# Patient Record
Sex: Female | Born: 1970 | Race: White | Hispanic: No | Marital: Single | Smoking: Current every day smoker
Health system: Southern US, Community
[De-identification: ages and names within clinical notes are randomized; demographics above are authoritative.]

## PROBLEM LIST (undated history)

## (undated) DIAGNOSIS — I10 Essential (primary) hypertension: Secondary | ICD-10-CM

## (undated) DIAGNOSIS — J449 Chronic obstructive pulmonary disease, unspecified: Secondary | ICD-10-CM

## (undated) DIAGNOSIS — G43909 Migraine, unspecified, not intractable, without status migrainosus: Secondary | ICD-10-CM

## (undated) HISTORY — PX: TUBAL LIGATION: SHX77

---

## 2001-01-14 ENCOUNTER — Encounter: Payer: Self-pay | Admitting: *Deleted

## 2001-01-14 ENCOUNTER — Emergency Department (HOSPITAL_COMMUNITY): Admission: EM | Admit: 2001-01-14 | Discharge: 2001-01-14 | Payer: Self-pay | Admitting: *Deleted

## 2001-04-06 ENCOUNTER — Other Ambulatory Visit: Admission: RE | Admit: 2001-04-06 | Discharge: 2001-04-06 | Payer: Self-pay

## 2004-03-28 ENCOUNTER — Emergency Department (HOSPITAL_COMMUNITY): Admission: EM | Admit: 2004-03-28 | Discharge: 2004-03-28 | Payer: Self-pay | Admitting: Emergency Medicine

## 2004-04-22 ENCOUNTER — Ambulatory Visit: Payer: Self-pay | Admitting: Family Medicine

## 2004-04-26 ENCOUNTER — Ambulatory Visit: Payer: Self-pay | Admitting: Family Medicine

## 2004-05-27 ENCOUNTER — Ambulatory Visit: Payer: Self-pay | Admitting: Family Medicine

## 2004-06-26 ENCOUNTER — Ambulatory Visit: Payer: Self-pay | Admitting: Family Medicine

## 2004-09-26 ENCOUNTER — Ambulatory Visit: Payer: Self-pay | Admitting: Family Medicine

## 2004-12-30 ENCOUNTER — Ambulatory Visit: Payer: Self-pay | Admitting: Family Medicine

## 2004-12-30 ENCOUNTER — Ambulatory Visit (HOSPITAL_COMMUNITY): Admission: RE | Admit: 2004-12-30 | Discharge: 2004-12-30 | Payer: Self-pay | Admitting: Family Medicine

## 2005-01-01 ENCOUNTER — Emergency Department (HOSPITAL_COMMUNITY): Admission: EM | Admit: 2005-01-01 | Discharge: 2005-01-01 | Payer: Self-pay | Admitting: Emergency Medicine

## 2005-01-06 ENCOUNTER — Emergency Department (HOSPITAL_COMMUNITY): Admission: EM | Admit: 2005-01-06 | Discharge: 2005-01-06 | Payer: Self-pay | Admitting: Emergency Medicine

## 2005-01-06 ENCOUNTER — Ambulatory Visit: Payer: Self-pay | Admitting: Family Medicine

## 2005-01-07 ENCOUNTER — Emergency Department (HOSPITAL_COMMUNITY): Admission: EM | Admit: 2005-01-07 | Discharge: 2005-01-07 | Payer: Self-pay | Admitting: Emergency Medicine

## 2005-01-07 ENCOUNTER — Ambulatory Visit (HOSPITAL_COMMUNITY): Admission: RE | Admit: 2005-01-07 | Discharge: 2005-01-07 | Payer: Self-pay | Admitting: Family Medicine

## 2005-01-07 ENCOUNTER — Ambulatory Visit (HOSPITAL_COMMUNITY): Admission: RE | Admit: 2005-01-07 | Discharge: 2005-01-07 | Payer: Self-pay | Admitting: Emergency Medicine

## 2005-01-22 ENCOUNTER — Ambulatory Visit: Payer: Self-pay | Admitting: Internal Medicine

## 2005-04-22 ENCOUNTER — Ambulatory Visit: Payer: Self-pay | Admitting: Internal Medicine

## 2005-04-28 ENCOUNTER — Ambulatory Visit: Payer: Self-pay | Admitting: Internal Medicine

## 2005-05-06 ENCOUNTER — Ambulatory Visit: Payer: Self-pay | Admitting: Family Medicine

## 2005-09-08 ENCOUNTER — Emergency Department (HOSPITAL_COMMUNITY): Admission: EM | Admit: 2005-09-08 | Discharge: 2005-09-08 | Payer: Self-pay | Admitting: Emergency Medicine

## 2005-11-14 ENCOUNTER — Emergency Department (HOSPITAL_COMMUNITY): Admission: EM | Admit: 2005-11-14 | Discharge: 2005-11-14 | Payer: Self-pay | Admitting: Emergency Medicine

## 2005-11-27 ENCOUNTER — Emergency Department (HOSPITAL_COMMUNITY): Admission: EM | Admit: 2005-11-27 | Discharge: 2005-11-27 | Payer: Self-pay | Admitting: Emergency Medicine

## 2006-03-18 ENCOUNTER — Emergency Department (HOSPITAL_COMMUNITY): Admission: EM | Admit: 2006-03-18 | Discharge: 2006-03-18 | Payer: Self-pay | Admitting: Emergency Medicine

## 2006-08-10 ENCOUNTER — Emergency Department (HOSPITAL_COMMUNITY): Admission: EM | Admit: 2006-08-10 | Discharge: 2006-08-10 | Payer: Self-pay | Admitting: Emergency Medicine

## 2006-10-22 ENCOUNTER — Emergency Department (HOSPITAL_COMMUNITY): Admission: EM | Admit: 2006-10-22 | Discharge: 2006-10-22 | Payer: Self-pay | Admitting: Emergency Medicine

## 2007-02-18 ENCOUNTER — Encounter: Payer: Self-pay | Admitting: Family Medicine

## 2007-05-05 ENCOUNTER — Emergency Department (HOSPITAL_COMMUNITY): Admission: EM | Admit: 2007-05-05 | Discharge: 2007-05-05 | Payer: Self-pay | Admitting: Emergency Medicine

## 2009-04-02 ENCOUNTER — Ambulatory Visit (HOSPITAL_COMMUNITY): Admission: RE | Admit: 2009-04-02 | Discharge: 2009-04-02 | Payer: Self-pay | Admitting: Family Medicine

## 2010-02-04 ENCOUNTER — Ambulatory Visit (HOSPITAL_COMMUNITY)
Admission: RE | Admit: 2010-02-04 | Discharge: 2010-02-04 | Payer: Self-pay | Source: Home / Self Care | Attending: Family Medicine | Admitting: Family Medicine

## 2010-03-11 ENCOUNTER — Encounter: Payer: Self-pay | Admitting: Internal Medicine

## 2010-03-19 NOTE — Letter (Signed)
Summary: rpc chart  rpc chart   Imported By: Curtis Sites 11/29/2009 15:47:20  _____________________________________________________________________  External Attachment:    Type:   Image     Comment:   External Document

## 2010-11-11 LAB — CBC
HCT: 43.6
Hemoglobin: 15.3 — ABNORMAL HIGH
MCHC: 35.1
MCV: 100.6 — ABNORMAL HIGH
Platelets: 447 — ABNORMAL HIGH
RBC: 4.33
RDW: 14.8
WBC: 10.1

## 2010-11-11 LAB — URINALYSIS, ROUTINE W REFLEX MICROSCOPIC
Bilirubin Urine: NEGATIVE
Glucose, UA: NEGATIVE
Ketones, ur: NEGATIVE
Leukocytes, UA: NEGATIVE
Nitrite: NEGATIVE
Protein, ur: NEGATIVE
Specific Gravity, Urine: 1.015
Urobilinogen, UA: 0.2
pH: 5.5

## 2010-11-11 LAB — ETHANOL: Alcohol, Ethyl (B): <5

## 2010-11-11 LAB — URINE MICROSCOPIC-ADD ON

## 2010-11-11 LAB — DIFFERENTIAL
Basophils Absolute: 0.1
Basophils Relative: 1
Eosinophils Absolute: 0.1
Eosinophils Relative: 1
Lymphocytes Relative: 19
Lymphs Abs: 1.9
Monocytes Absolute: 0.3
Monocytes Relative: 3
Neutro Abs: 7.8 — ABNORMAL HIGH
Neutrophils Relative %: 77

## 2010-11-11 LAB — BASIC METABOLIC PANEL
BUN: 6
CO2: 25
Calcium: 9.7
Chloride: 107
Creatinine, Ser: 0.77
GFR calc Af Amer: >60
GFR calc non Af Amer: >60
Glucose, Bld: 99
Potassium: 3.6
Sodium: 139

## 2010-11-11 LAB — RAPID URINE DRUG SCREEN, HOSP PERFORMED
Amphetamines: NOT DETECTED
Barbiturates: NOT DETECTED
Benzodiazepines: POSITIVE — AB
Cocaine: NOT DETECTED
Opiates: NOT DETECTED
Tetrahydrocannabinol: POSITIVE — AB

## 2010-11-11 LAB — PREGNANCY, URINE: Preg Test, Ur: NEGATIVE

## 2010-12-04 LAB — URINALYSIS, ROUTINE W REFLEX MICROSCOPIC
Bilirubin Urine: NEGATIVE
Glucose, UA: NEGATIVE
Ketones, ur: NEGATIVE
Leukocytes, UA: NEGATIVE
Nitrite: NEGATIVE
Protein, ur: NEGATIVE
Specific Gravity, Urine: <1.005 — ABNORMAL LOW
Urobilinogen, UA: 0.2
pH: 6.5

## 2010-12-04 LAB — DIFFERENTIAL
Blasts: 0
Lymphocytes Relative: 28
Myelocytes: 0
Neutrophils Relative %: 67
Promyelocytes Absolute: 0

## 2010-12-04 LAB — COMPREHENSIVE METABOLIC PANEL
Alkaline Phosphatase: 95
BUN: 7
CO2: 26
Chloride: 100
Glucose, Bld: 81
Potassium: 2.9 — ABNORMAL LOW
Total Bilirubin: 0.6

## 2010-12-04 LAB — URINE MICROSCOPIC-ADD ON

## 2010-12-04 LAB — PREGNANCY, URINE: Preg Test, Ur: NEGATIVE

## 2010-12-04 LAB — CBC
HCT: 42.1
Hemoglobin: 14
RBC: 4.36
WBC: 8.5

## 2010-12-04 LAB — LIPASE, BLOOD: Lipase: 24

## 2011-03-27 ENCOUNTER — Other Ambulatory Visit (HOSPITAL_COMMUNITY): Payer: Self-pay | Admitting: Family Medicine

## 2011-03-27 DIAGNOSIS — Z139 Encounter for screening, unspecified: Secondary | ICD-10-CM

## 2011-03-28 ENCOUNTER — Ambulatory Visit (HOSPITAL_COMMUNITY)
Admission: RE | Admit: 2011-03-28 | Discharge: 2011-03-28 | Disposition: A | Discharge status: Home / Self Care | Payer: PRIVATE HEALTH INSURANCE | Source: Ambulatory Visit | Attending: Family Medicine | Admitting: Family Medicine

## 2011-03-28 DIAGNOSIS — Z1231 Encounter for screening mammogram for malignant neoplasm of breast: Secondary | ICD-10-CM | POA: Insufficient documentation

## 2011-03-28 DIAGNOSIS — Z139 Encounter for screening, unspecified: Secondary | ICD-10-CM

## 2012-05-16 ENCOUNTER — Encounter: Payer: Self-pay | Admitting: Internal Medicine

## 2012-05-16 NOTE — Assessment & Plan Note (Signed)
Could simply be aspiration pneumonitis that will resolve spontaneously and fevers are entirely due to the buttock lesion.  Continue aspiration precautions, adequate hydration, and obtain CXR to r/o pneumonia.  Call results to md/np for orders.

## 2012-05-16 NOTE — Assessment & Plan Note (Signed)
Abscess is suspected.  Labs including cbc and bmp were ordered to assess for wbc and to determine if she is volume deplete from her fevers (also in anticipation of switching to IV abx) from keflex.  Will also obtain ultrasound of buttock to confirm that this is an abscess.  She will likely need surgical intervention if her mother would want this at this point.

## 2014-09-22 ENCOUNTER — Encounter (HOSPITAL_COMMUNITY): Payer: Self-pay | Admitting: Emergency Medicine

## 2014-09-22 ENCOUNTER — Emergency Department (HOSPITAL_COMMUNITY): Payer: Medicaid Other

## 2014-09-22 ENCOUNTER — Emergency Department (HOSPITAL_COMMUNITY)
Admission: EM | Admit: 2014-09-22 | Discharge: 2014-09-22 | Disposition: A | Discharge status: Home / Self Care | Payer: Medicaid Other | Attending: Emergency Medicine | Admitting: Emergency Medicine

## 2014-09-22 DIAGNOSIS — Y9289 Other specified places as the place of occurrence of the external cause: Secondary | ICD-10-CM | POA: Insufficient documentation

## 2014-09-22 DIAGNOSIS — X58XXXA Exposure to other specified factors, initial encounter: Secondary | ICD-10-CM | POA: Insufficient documentation

## 2014-09-22 DIAGNOSIS — S99921A Unspecified injury of right foot, initial encounter: Secondary | ICD-10-CM | POA: Insufficient documentation

## 2014-09-22 DIAGNOSIS — Y9389 Activity, other specified: Secondary | ICD-10-CM | POA: Insufficient documentation

## 2014-09-22 DIAGNOSIS — Y998 Other external cause status: Secondary | ICD-10-CM | POA: Insufficient documentation

## 2014-09-22 DIAGNOSIS — Z720 Tobacco use: Secondary | ICD-10-CM | POA: Insufficient documentation

## 2014-09-22 MED ORDER — IBUPROFEN 800 MG PO TABS
800.0000 mg | ORAL_TABLET | Freq: Once | ORAL | Status: AC
  Administered 2014-09-22: 800 mg via ORAL
  Filled 2014-09-22: qty 1

## 2014-09-22 MED ORDER — NAPROXEN 500 MG PO TABS: 500.0000 mg | ORAL_TABLET | Freq: Two times a day (BID) | ORAL | Status: DC

## 2014-09-22 MED ORDER — HYDROCODONE-ACETAMINOPHEN 5-325 MG PO TABS
1.0000 | ORAL_TABLET | Freq: Once | ORAL | Status: AC
  Administered 2014-09-22: 1 via ORAL
  Filled 2014-09-22: qty 1

## 2014-09-22 MED ORDER — HYDROCODONE-ACETAMINOPHEN 5-325 MG PO TABS: ORAL_TABLET | ORAL | Status: DC

## 2014-09-22 NOTE — Discharge Instructions (Signed)
Blunt Trauma °You have been evaluated for injuries. You have been examined and your caregiver has not found injuries serious enough to require hospitalization. °It is common to have multiple bruises and sore muscles following an accident. These tend to feel worse for the first 24 hours. You will feel more stiffness and soreness over the next several hours and worse when you wake up the first morning after your accident. After this point, you should begin to improve with each passing day. The amount of improvement depends on the amount of damage done in the accident. °Following your accident, if some part of your body does not work as it should, or if the pain in any area continues to increase, you should return to the Emergency Department for re-evaluation.  °HOME CARE INSTRUCTIONS  °Routine care for sore areas should include: °· Ice to sore areas every 2 hours for 20 minutes while awake for the next 2 days. °· Drink extra fluids (not alcohol). °· Take a hot or warm shower or bath once or twice a day to increase blood flow to sore muscles. This will help you "limber up". °· Activity as tolerated. Lifting may aggravate neck or back pain. °· Only take over-the-counter or prescription medicines for pain, discomfort, or fever as directed by your caregiver. Do not use aspirin. This may increase bruising or increase bleeding if there are small areas where this is happening. °SEEK IMMEDIATE MEDICAL CARE IF: °· Numbness, tingling, weakness, or problem with the use of your arms or legs. °· A severe headache is not relieved with medications. °· There is a change in bowel or bladder control. °· Increasing pain in any areas of the body. °· Short of breath or dizzy. °· Nauseated, vomiting, or sweating. °· Increasing belly (abdominal) discomfort. °· Blood in urine, stool, or vomiting blood. °· Pain in either shoulder in an area where a shoulder strap would be. °· Feelings of lightheadedness or if you have a fainting  episode. °Sometimes it is not possible to identify all injuries immediately after the trauma. It is important that you continue to monitor your condition after the emergency department visit. If you feel you are not improving, or improving more slowly than should be expected, call your physician. If you feel your symptoms (problems) are worsening, return to the Emergency Department immediately. °Document Released: 10/30/2000 Document Revised: 04/28/2011 Document Reviewed: 09/22/2007 °[employer]® Patient Information ©2015 [employer]. This information is not intended to replace advice given to you by your health care provider. Make sure you discuss any questions you have with your health care provider. ° °

## 2014-09-22 NOTE — ED Provider Notes (Signed)
CSN: [id number]     Arrival date & time 09/22/14  1203 History   First MD Initiated Contact with Patient 09/22/14 1226     Chief Complaint  Patient presents with  . Toe Pain     (Consider location/radiation/quality/duration/timing/severity/associated sxs/prior Treatment) HPI   Lauren Mathis is a 44 y.o. female who presents to the Emergency Department complaining of pain and discoloration of the right fifth toe.  She noticed her symptoms one ago ago after taking her shoes off after work.  She also noticed "splotchy bruises" to her lateral foot.  She describes a throbbing, constant pain, worse with palpation and weight bearing.  She denies known injury.  She also denies swelling, numbness or weakness, rash, fever, or pain proximal to the foot.     History reviewed. No pertinent past medical history. History reviewed. No pertinent past surgical history. History reviewed. No pertinent family history. History  Substance Use Topics  . Smoking status: Current Every Day Smoker -- 1.00 packs/day    Types: Cigarettes  . Smokeless tobacco: Not on file  . Alcohol Use: No   OB History    No data available     Review of Systems  Constitutional: Negative for fever and chills.  Gastrointestinal: Negative for nausea and vomiting.  Musculoskeletal: Positive for arthralgias (right fifth toe pain). Negative for joint swelling.  Skin: Negative for color change, rash and wound.  Neurological: Negative for dizziness, weakness and numbness.  All other systems reviewed and are negative.     Allergies  Review of patient's allergies indicates no known allergies.  Home Medications   Prior to Admission medications   Not on File   BP 140/77 mmHg  Pulse 56  Temp(Src) 97.9 F (36.6 C) (Oral)  Resp 18  Ht 5\' 3"  (1.6 m)  Wt 126 lb (57.153 kg)  BMI 22.33 kg/m2  SpO2 100%  LMP 08/01/2014 Physical Exam  Constitutional: She is oriented to person, place, and time. She appears well-developed and  well-nourished. No distress.  HENT:  Head: Normocephalic and atraumatic.  Mouth/Throat: Oropharynx is clear and moist.  Neck: Normal range of motion. Neck supple.  Cardiovascular: Normal rate, regular rhythm, normal heart sounds and intact distal pulses.   No murmur heard. Pulmonary/Chest: Effort normal and breath sounds normal. No respiratory distress.  Musculoskeletal: Normal range of motion. She exhibits tenderness. She exhibits no edema.  ecchymosis of the right fifth toe with mottling to the lateral right foot.  Toe is warm and blanches.  No bony deformity, no open wounds.  DP pulse is brisk and symmetrical.  Distal sensation intact.  No proximal tenderness  Lymphadenopathy:    She has no cervical adenopathy.  Neurological: She is alert and oriented to person, place, and time. She exhibits normal muscle tone. Coordination normal.  Skin: Skin is warm. No rash noted. No erythema.  Nursing note and vitals reviewed.   ED Course  Procedures (including critical care time) Labs Review Labs Reviewed - No data to display  Imaging Review Dg Toe 5th Right  09/22/2014   CLINICAL DATA:  Pain in pinky toe.  Discoloration.  EXAM: RIGHT FIFTH TOE  COMPARISON:  None.  FINDINGS: Soft tissue swelling is present about the fifth digit. There is no underlying fracture. No osseous erosion is seen. The joints are located.  IMPRESSION: Soft tissue swelling in the fifth digit without acute or focal osseous abnormality. This may be related to 2 cellulitis or posttraumatic edema.   Electronically Signed  By: Marin Roberts M.D.   On: 09/22/2014 12:57     EKG Interpretation None      MDM   Final diagnoses:  Toe injury, right, initial encounter    Pt with likely posttraumatic injury of the fifth toe.  NV intact.  Pt agrees to symptomatic tx.  Advised to elevate and anti-inflammatory.  Given strict return precautions.  Pt agrees to plan  Pt also seen by Dr. Ethelda Chick and care plan discussed.        Pauline Aus, PA-C 09/22/14 1610  Doug Sou, MD 09/23/14 [phone]

## 2014-09-22 NOTE — ED Notes (Signed)
Pt states that her right pinkie toe has been bruised and hurting since yesterday with no recalled injury.

## 2014-09-22 NOTE — ED Provider Notes (Signed)
Plains of pain at right fifth toe and lateral aspect of fifth foot since yesterday. Pain worse with weightbearing. No injury. On exam alert no distress left lower extremity no swelling fifth toe is diffusely ecchymotic and tender. Good capillary refill she has ecchymoses along the lateral aspect of the foot. DP pulse 2+ all other toes are nontender. Good capillary refill. There is no soft tissue swelling.  Doug Sou, MD 09/22/14 1311

## 2014-09-22 NOTE — ED Provider Notes (Signed)
X-ray viewed by me  Doug Sou, MD 09/22/14 1328

## 2014-09-30 ENCOUNTER — Emergency Department (HOSPITAL_COMMUNITY)
Admission: EM | Admit: 2014-09-30 | Discharge: 2014-09-30 | Disposition: A | Discharge status: Home / Self Care | Payer: Medicaid Other | Attending: Emergency Medicine | Admitting: Emergency Medicine

## 2014-09-30 ENCOUNTER — Encounter (HOSPITAL_COMMUNITY): Payer: Self-pay | Admitting: Emergency Medicine

## 2014-09-30 DIAGNOSIS — Z791 Long term (current) use of non-steroidal anti-inflammatories (NSAID): Secondary | ICD-10-CM | POA: Diagnosis not present

## 2014-09-30 DIAGNOSIS — X58XXXD Exposure to other specified factors, subsequent encounter: Secondary | ICD-10-CM | POA: Insufficient documentation

## 2014-09-30 DIAGNOSIS — S90121D Contusion of right lesser toe(s) without damage to nail, subsequent encounter: Secondary | ICD-10-CM | POA: Diagnosis not present

## 2014-09-30 DIAGNOSIS — Z7951 Long term (current) use of inhaled steroids: Secondary | ICD-10-CM | POA: Diagnosis not present

## 2014-09-30 DIAGNOSIS — Z720 Tobacco use: Secondary | ICD-10-CM | POA: Insufficient documentation

## 2014-09-30 DIAGNOSIS — Z79899 Other long term (current) drug therapy: Secondary | ICD-10-CM | POA: Insufficient documentation

## 2014-09-30 DIAGNOSIS — M79674 Pain in right toe(s): Secondary | ICD-10-CM | POA: Diagnosis present

## 2014-09-30 DIAGNOSIS — S99921D Unspecified injury of right foot, subsequent encounter: Secondary | ICD-10-CM

## 2014-09-30 MED ORDER — HYDROCODONE-ACETAMINOPHEN 5-325 MG PO TABS
1.0000 | ORAL_TABLET | Freq: Once | ORAL | Status: AC
  Administered 2014-09-30: 1 via ORAL
  Filled 2014-09-30: qty 1

## 2014-09-30 MED ORDER — HYDROCODONE-ACETAMINOPHEN 5-325 MG PO TABS: ORAL_TABLET | ORAL | Status: DC

## 2014-09-30 NOTE — ED Notes (Signed)
Pt. Reports pain to right little toe. Pt. With bruising to toe.

## 2014-10-02 NOTE — ED Provider Notes (Signed)
CSN: [id number]     Arrival date & time 09/30/14  2022 History   First MD Initiated Contact with Patient 09/30/14 2050     Chief Complaint  Patient presents with  . Toe Pain     (Consider location/radiation/quality/duration/timing/severity/associated sxs/prior Treatment) HPI   Lauren Mathis is a 44 y.o. female who presents to the Emergency Department c/o continued pain and discoloration of the right fifth toe and lateral foot.  She states that she has continued to walk and stand at her job and pain to her foot is not improving.  She states the discoloration has improved.  She denies new symptoms including weakness, swelling or temperature changes to the extremity.  She has not made f/u arrangements.    History reviewed. No pertinent past medical history. History reviewed. No pertinent past surgical history. No family history on file. Social History  Substance Use Topics  . Smoking status: Current Every Day Smoker -- 1.00 packs/day    Types: Cigarettes  . Smokeless tobacco: None  . Alcohol Use: No   OB History    No data available     Review of Systems  Constitutional: Negative for fever and chills.  Musculoskeletal: Positive for arthralgias (right foot and little toe pain). Negative for joint swelling.  Skin: Positive for color change. Negative for wound.       bruising of the toe  Neurological: Negative for weakness and numbness.  All other systems reviewed and are negative.     Allergies  Review of patient's allergies indicates no known allergies.  Home Medications   Prior to Admission medications   Medication Sig Start Date End Date Taking? Authorizing Provider  albuterol (PROVENTIL HFA;VENTOLIN HFA) 108 (90 BASE) MCG/ACT inhaler Inhale 2 puffs into the lungs every 6 (six) hours as needed for wheezing or shortness of breath.   Yes Historical Provider, MD  fluticasone (FLONASE) 50 MCG/ACT nasal spray Place 2 sprays into both nostrils daily.   Yes Historical Provider,  MD  Fluticasone-Salmeterol (ADVAIR) 100-50 MCG/DOSE AEPB Inhale 1 puff into the lungs 2 (two) times daily.   Yes Historical Provider, MD  folic acid (FOLVITE) 1 MG tablet Take 1 mg by mouth daily.   Yes Historical Provider, MD  ibuprofen (ADVIL,MOTRIN) 200 MG tablet Take 800-1,000 mg by mouth every 6 (six) hours as needed for moderate pain.   Yes Historical Provider, MD  HYDROcodone-acetaminophen (NORCO/VICODIN) 5-325 MG per tablet Take one-two tabs po q 4-6 hrs prn pain 09/30/14   Levon Boettcher, PA-C  naproxen (NAPROSYN) 500 MG tablet Take 1 tablet (500 mg total) by mouth 2 (two) times daily with a meal. 09/22/14   Earnestine Shipp, PA-C   BP 117/69 mmHg  Pulse 72  Temp(Src) 98.2 F (36.8 C) (Oral)  Resp 16  Ht  (1.6 m)  Wt 126 lb (57.153 kg)  BMI 22.33 kg/m2  SpO2 99%  LMP 08/01/2014 Physical Exam  Constitutional: She is oriented to person, place, and time. She appears well-developed and well-nourished. No distress.  HENT:  Head: Normocephalic and atraumatic.  Cardiovascular: Normal rate, regular rhythm, normal heart sounds and intact distal pulses.   Pulmonary/Chest: Effort normal and breath sounds normal. No respiratory distress.  Musculoskeletal: She exhibits tenderness. She exhibits no edema.  ttp of the lateral right foot and fifth toe. Ecchymosis improving compared to previous visit.  ROM is preserved.  DP pulse is brisk,distal sensation intact.  Skin is warm and dry.  No proximal tenderness.  Neurological: She is alert  and oriented to person, place, and time. She exhibits normal muscle tone. Coordination normal.  Skin: Skin is warm and dry.  Nursing note and vitals reviewed.   ED Course  Procedures (including critical care time) Labs Review Labs Reviewed - No data to display  Imaging Review   EKG Interpretation None      MDM   Final diagnoses:  Toe injury, right, subsequent encounter    Pt was also seen by me as well as Dr. Ethelda Chick on her previous visit.   Bruising appears to be improving.  remains NV intact.  CR< 2 sec.  Agrees to continued symptomatic tx and advised to arrange ortho f/u.  Post op shoe given for comfort.  Pt appears stable for d/c and agrees to plan.       Pauline Aus, PA-C 10/02/14 1737  Doug Sou, MD 10/09/14 1500

## 2014-10-20 ENCOUNTER — Emergency Department (HOSPITAL_COMMUNITY)
Admission: EM | Admit: 2014-10-20 | Discharge: 2014-10-20 | Disposition: A | Discharge status: Home / Self Care | Payer: Medicaid Other | Attending: Physician Assistant | Admitting: Physician Assistant

## 2014-10-20 ENCOUNTER — Encounter (HOSPITAL_COMMUNITY): Payer: Self-pay | Admitting: Emergency Medicine

## 2014-10-20 DIAGNOSIS — Z79899 Other long term (current) drug therapy: Secondary | ICD-10-CM | POA: Insufficient documentation

## 2014-10-20 DIAGNOSIS — L259 Unspecified contact dermatitis, unspecified cause: Secondary | ICD-10-CM | POA: Insufficient documentation

## 2014-10-20 DIAGNOSIS — Z7951 Long term (current) use of inhaled steroids: Secondary | ICD-10-CM | POA: Diagnosis not present

## 2014-10-20 DIAGNOSIS — Z791 Long term (current) use of non-steroidal anti-inflammatories (NSAID): Secondary | ICD-10-CM | POA: Diagnosis not present

## 2014-10-20 DIAGNOSIS — L988 Other specified disorders of the skin and subcutaneous tissue: Secondary | ICD-10-CM | POA: Diagnosis present

## 2014-10-20 DIAGNOSIS — Z720 Tobacco use: Secondary | ICD-10-CM | POA: Diagnosis not present

## 2014-10-20 MED ORDER — TRIAMCINOLONE ACETONIDE 0.025 % EX CREA
TOPICAL_CREAM | Freq: Once | CUTANEOUS | Status: AC
  Administered 2014-10-20: 1 via TOPICAL
  Filled 2014-10-20: qty 15

## 2014-10-20 NOTE — ED Notes (Signed)
Patient given discharge instruction, verbalized understand. Patient ambulatory out of the department.  

## 2014-10-20 NOTE — ED Provider Notes (Signed)
CSN: [id number]     Arrival date & time 10/20/14  1605 History   First MD Initiated Contact with Patient 10/20/14 1615     Chief Complaint  Patient presents with  . Facial Burn     (Consider location/radiation/quality/duration/timing/severity/associated sxs/prior Treatment) HPI  Patient is a 44 year old female who was exposed to oven cleaner one week ago. Since then she has had an irritated skin around her mouth and her cheeks. It has not spread all. She has no systetmic  Symptoms. Patient states it is itching/painful dry. Has not tried anything except vaseline. She just moved here from [place] and has no physician.   History reviewed. No pertinent past medical history. Past Surgical History  Procedure Laterality Date  . Tubal ligation     No family history on file. Social History  Substance Use Topics  . Smoking status: Current Every Day Smoker -- 1.00 packs/day    Types: Cigarettes  . Smokeless tobacco: None  . Alcohol Use: No   OB History    No data available     Review of Systems  Constitutional: Negative for fever, activity change and fatigue.  Respiratory: Negative for shortness of breath.   Cardiovascular: Negative for chest pain.  Gastrointestinal: Negative for abdominal pain.  Psychiatric/Behavioral: Negative for agitation.      Allergies  Review of patient's allergies indicates no known allergies.  Home Medications   Prior to Admission medications   Medication Sig Start Date End Date Taking? Authorizing Provider  albuterol (PROVENTIL HFA;VENTOLIN HFA) 108 (90 BASE) MCG/ACT inhaler Inhale 2 puffs into the lungs every 6 (six) hours as needed for wheezing or shortness of breath.    Historical Provider, MD  fluticasone (FLONASE) 50 MCG/ACT nasal spray Place 2 sprays into both nostrils daily.    Historical Provider, MD  Fluticasone-Salmeterol (ADVAIR) 100-50 MCG/DOSE AEPB Inhale 1 puff into the lungs 2 (two) times daily.    Historical Provider, MD  folic  acid (FOLVITE) 1 MG tablet Take 1 mg by mouth daily.    Historical Provider, MD  HYDROcodone-acetaminophen (NORCO/VICODIN) 5-325 MG per tablet Take one-two tabs po q 4-6 hrs prn pain 09/30/14   Tammy Triplett, PA-C  ibuprofen (ADVIL,MOTRIN) 200 MG tablet Take 800-1,000 mg by mouth every 6 (six) hours as needed for moderate pain.    Historical Provider, MD  naproxen (NAPROSYN) 500 MG tablet Take 1 tablet (500 mg total) by mouth 2 (two) times daily with a meal. 09/22/14   Tammy Triplett, PA-C   BP 147/84 mmHg  Pulse 61  Temp(Src) 97.8 F (36.6 C) (Oral)  Resp 18  Ht 5\' 3"  (1.6 m)  Wt 125 lb (56.7 kg)  BMI 22.15 kg/m2  SpO2 91%  LMP 08/01/2014 Physical Exam  Constitutional: She is oriented to person, place, and time. She appears well-developed and well-nourished.  HENT:  Head: Normocephalic and atraumatic.  Eyes: Right eye exhibits no discharge.  Cardiovascular: Normal rate, regular rhythm and normal heart sounds.   No murmur heard. Pulmonary/Chest: Effort normal and breath sounds normal. She has no wheezes. She has no rales.  Abdominal: Soft. She exhibits no distension. There is no tenderness.  Neurological: She is oriented to person, place, and time.  Skin: Skin is warm and dry. She is not diaphoretic.  Patient has irritated skin consistent with contact of contact dermatitis surrounding her mouth and bilateral cheeks. No open sores, erthematous base.  Psychiatric: She has a normal mood and affect.  Nursing note and vitals reviewed.   ED  Course  Procedures (including critical care time) Labs Review Labs Reviewed - No data to display  Imaging Review No results found. I have personally reviewed and evaluated these images and lab results as part of my medical decision-making.   EKG Interpretation None      MDM   Final diagnoses:  None    Patient is a 44 year old female presenting with contact dermatitis after being exposed to oven cleaner one week ago. Has not spread all  therefore doubt any infectious etiology. Patient has no systemic symptoms. Patient is no health insurance and no follow-up. We will find a cortisone cream that is the appropriate low potency given this is her face, and on the $4 list for [facility] so  patient can afford cream.  Patient warned that she cannot use this cream for more than one week.  Leighla Chestnutt Randall An, MD 10/20/14 1645

## 2014-10-20 NOTE — Discharge Instructions (Signed)
Please apply the cream 3 times a day. Do not use any longer than one week. If you use it longer than 1 week it'll hurt your skin.  Please follow-up with a dermatologist near you.

## 2014-10-20 NOTE — ED Notes (Signed)
Pt states almost two weeks ago she was cleaning an oven at work when the chemicals splashed onto her face. Pt noted to have redness, peeling, and scattered bumps around mouth and on lower cheeks. Pt states it burns and itches. Pt states it has become increasingly worse. NAD noted. Airway patent. Denies SOB.

## 2015-09-16 ENCOUNTER — Emergency Department (HOSPITAL_COMMUNITY): Payer: Medicaid Other

## 2015-09-16 ENCOUNTER — Emergency Department (HOSPITAL_COMMUNITY)
Admission: EM | Admit: 2015-09-16 | Discharge: 2015-09-16 | Disposition: A | Discharge status: Home / Self Care | Payer: Medicaid Other | Attending: Emergency Medicine | Admitting: Emergency Medicine

## 2015-09-16 ENCOUNTER — Encounter (HOSPITAL_COMMUNITY): Payer: Self-pay | Admitting: Emergency Medicine

## 2015-09-16 DIAGNOSIS — J449 Chronic obstructive pulmonary disease, unspecified: Secondary | ICD-10-CM | POA: Insufficient documentation

## 2015-09-16 DIAGNOSIS — H9209 Otalgia, unspecified ear: Secondary | ICD-10-CM | POA: Diagnosis not present

## 2015-09-16 DIAGNOSIS — J209 Acute bronchitis, unspecified: Secondary | ICD-10-CM

## 2015-09-16 DIAGNOSIS — Z791 Long term (current) use of non-steroidal anti-inflammatories (NSAID): Secondary | ICD-10-CM | POA: Diagnosis not present

## 2015-09-16 DIAGNOSIS — F17210 Nicotine dependence, cigarettes, uncomplicated: Secondary | ICD-10-CM | POA: Insufficient documentation

## 2015-09-16 DIAGNOSIS — J3489 Other specified disorders of nose and nasal sinuses: Secondary | ICD-10-CM | POA: Diagnosis not present

## 2015-09-16 DIAGNOSIS — M546 Pain in thoracic spine: Secondary | ICD-10-CM | POA: Diagnosis present

## 2015-09-16 DIAGNOSIS — Z79899 Other long term (current) drug therapy: Secondary | ICD-10-CM | POA: Insufficient documentation

## 2015-09-16 HISTORY — DX: Chronic obstructive pulmonary disease, unspecified: J44.9

## 2015-09-16 MED ORDER — BENZONATATE 100 MG PO CAPS: 200.0000 mg | ORAL_CAPSULE | Freq: Three times a day (TID) | ORAL | 0 refills | Status: DC | PRN

## 2015-09-16 MED ORDER — AZITHROMYCIN 250 MG PO TABS
ORAL_TABLET | ORAL | 0 refills | Status: DC
Start: 2015-09-16 — End: 2017-02-24

## 2015-09-16 MED ORDER — PROMETHAZINE-CODEINE 6.25-10 MG/5ML PO SYRP: 5.0000 mL | ORAL_SOLUTION | ORAL | 0 refills | Status: DC | PRN

## 2015-09-16 NOTE — ED Notes (Signed)
Pt back from Radiology. Ambulatory to bathroom.

## 2015-09-16 NOTE — ED Triage Notes (Signed)
Pt reports R upper back pain  That became much worse yesterday. Cough x 1 1/2 weeks, productive at times with thick yellow sputum. Pt reports chills but no fever.

## 2015-09-16 NOTE — ED Notes (Signed)
Pt to Radiology

## 2015-09-16 NOTE — ED Provider Notes (Signed)
[facility]-EMERGENCY DEPT Provider Note   CSN: [id number] Arrival date & time: 09/16/15  1044  First Provider Contact:   First MD Initiated Contact with Patient 09/16/15 1201     By signing my name below, I, Lauren Mathis, attest that this documentation has been prepared under the direction and in the presence of Burgess Amor, PA-C Electronically Signed: Soijett Mathis, ED Scribe. 09/16/15. 12:23 PM.    History   Chief Complaint Chief Complaint  Patient presents with  . Back Pain    HPI Lauren Mathis is a 45 y.o. female who presents to the Emergency Department complaining of sharp, gradually worsening right upper back pain onset 1.5 weeks worsened with coughing and movement and palpation along with persistent cough onset 1.5 weeks. Her cough is worse at night and denies any alleviating factors. Pt has associated symptoms of chills without documented fever, clear rhinorrhea, and mild right ear pain. Pt has tried OTC menthol rub, nyquil, albuterol inhaler BID, Advair inhaler, and flonase, with no relief of her symptoms. She denies fever, nasal congestion, post-nasal drip, SOB, left ear pain, n/v, abdominal pain, wheezing, and any other symptoms. [facility]. Pt reports that she is a current cigarette smoker and she smokes 1 PPD. 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 Pt uses advair and albuterol inhaler for her COPD. Pt reports that she takes daily claritin, but she is unaware if she has seasonal allergies. Denies allergies to medications. Her husband recently had similar symptoms from which he is still improving.  The history is provided by the patient. No language interpreter was used.    Past Medical History:  Diagnosis Date  . COPD (chronic obstructive pulmonary disease) (HCC)     There are no active problems to display for this patient.   Past Surgical History:  Procedure Laterality Date  . TUBAL LIGATION      OB History    Gravida Para Term Preterm AB  Living   3 3 3          SAB TAB Ectopic Multiple Live Births                   Home Medications    Prior to Admission medications   Medication Sig Start Date End Date Taking? Authorizing Provider  albuterol (PROVENTIL HFA;VENTOLIN HFA) 108 (90 BASE) MCG/ACT inhaler Inhale 2 puffs into the lungs every 6 (six) hours as needed for wheezing or shortness of breath.    Historical Provider, MD  azithromycin (ZITHROMAX Z-PAK) 250 MG tablet Take 2 tablets by mouth on day one followed by one tablet daily for 4 days. 09/16/15   Burgess Amor, PA-C  benzonatate (TESSALON) 100 MG capsule Take 2 capsules (200 mg total) by mouth 3 (three) times daily as needed. 09/16/15   Burgess Amor, PA-C  fluticasone (FLONASE) 50 MCG/ACT nasal spray Place 2 sprays into both nostrils daily.    Historical Provider, MD  Fluticasone-Salmeterol (ADVAIR) 100-50 MCG/DOSE AEPB Inhale 1 puff into the lungs 2 (two) times daily.    Historical Provider, MD  folic acid (FOLVITE) 1 MG tablet Take 1 mg by mouth daily.    Historical Provider, MD  HYDROcodone-acetaminophen (NORCO/VICODIN) 5-325 MG per tablet Take one-two tabs po q 4-6 hrs prn pain Patient not taking: Reported on 10/20/2014 09/30/14   Tammy Triplett, PA-C  ibuprofen (ADVIL,MOTRIN) 200 MG tablet Take 800-1,000 mg by mouth every 6 (six) hours as needed for moderate pain.  Historical Provider, MD  naproxen (NAPROSYN) 500 MG tablet Take 1 tablet (500 mg total) by mouth 2 (two) times daily with a meal. Patient not taking: Reported on 10/20/2014 09/22/14   Tammy Triplett, PA-C  promethazine-codeine (PHENERGAN WITH CODEINE) 6.25-10 MG/5ML syrup Take 5 mLs by mouth every 4 (four) hours as needed for cough. 09/16/15   Burgess Amor, PA-C    Family History Family History  Problem Relation Age of Onset  . Asthma Mother   . Asthma Father     Social History Social History  Substance Use Topics  . Smoking status: Current Every Day Smoker    Packs/day: 1.00    Types: Cigarettes  .  Smokeless tobacco: Never Used  . Alcohol use Yes     Comment: occas     Allergies   Review of patient's allergies indicates no known allergies.   Review of Systems Review of Systems  Constitutional: Positive for chills. Negative for fever.  HENT: Positive for ear pain (right eary) and rhinorrhea. Negative for congestion.   Respiratory: Positive for cough (productive). Negative for wheezing.   Gastrointestinal: Negative for abdominal pain, nausea and vomiting.  Musculoskeletal: Positive for back pain (right upper).     Physical Exam Updated Vital Signs BP 133/82 (BP Location: Right Arm)   Pulse (!) 51   Temp 98.2 F (36.8 C) (Oral)   Resp 16   Ht 5\' 3"  (1.6 m)   Wt 47.6 kg   LMP 07/17/2015   SpO2 99%   BMI 18.60 kg/m   Physical Exam  Constitutional: She is oriented to person, place, and time. She appears well-developed and well-nourished.  HENT:  Head: Normocephalic and atraumatic.  Right Ear: Tympanic membrane and ear canal normal.  Left Ear: Tympanic membrane and ear canal normal.  Nose: Mucosal edema and rhinorrhea present.  Mouth/Throat: Uvula is midline, oropharynx is clear and moist and mucous membranes are normal. No oropharyngeal exudate, posterior oropharyngeal edema, posterior oropharyngeal erythema or tonsillar abscesses.  Eyes: Conjunctivae are normal.  Cardiovascular: Normal rate and normal heart sounds.   Pulmonary/Chest: Effort normal. No respiratory distress. She has no wheezes. She has no rales.  Lungs clear to ausculation bilaterally.   Abdominal: Soft. There is no tenderness.  Musculoskeletal: Normal range of motion. She exhibits tenderness.  Tenderness to right upper back above the level of the scapula. No muscle spasm or deformity.  Neurological: She is alert and oriented to person, place, and time.  Skin: Skin is warm and dry. No rash noted.  Psychiatric: She has a normal mood and affect.     ED Treatments / Results  DIAGNOSTIC  STUDIES: Oxygen Saturation is 99% on RA, nl by my interpretation.    COORDINATION OF CARE: 12:20 PM Discussed treatment plan with pt at bedside which includes CXR, abx Rx, tessalon perles Rx, and pt agreed to plan.   Radiology Dg Chest 2 View  Result Date: 09/16/2015 CLINICAL DATA:  Acute productive cough today. EXAM: CHEST  2 VIEW COMPARISON:  10/22/2006 and prior radiograph FINDINGS: The cardiomediastinal silhouette is unremarkable. There is no evidence of focal airspace disease, pulmonary edema, suspicious pulmonary nodule/mass, pleural effusion, or pneumothorax. No acute bony abnormalities are identified. IMPRESSION: No active cardiopulmonary disease. Electronically Signed   By: Harmon Pier M.D.   On: 09/16/2015 11:21   Procedures Procedures (including critical care time)  Medications Ordered in ED Medications - No data to display   Initial Impression / Assessment and Plan / ED Course  I have  reviewed the triage vital signs and the nursing notes.  Pertinent imaging results that were available during my care of the patient were reviewed by me and considered in my medical decision making (see chart for details).  Clinical Course    Pt with hx and exam findings suggesting acute bronchitis with upper back strain from cough.  This pain is reproducible.  VSS, no sob, doubt PE with no risk factors. Will cover with zithromax given COPD hx. Encouraged continued home copd meds, added tessalon and phenergan/codeine for cough suppression.    Final Clinical Impressions(s) / ED Diagnoses   Final diagnoses:  Acute bronchitis, unspecified organism    New Prescriptions Discharge Medication List as of 09/16/2015 12:29 PM    START taking these medications   Details  azithromycin (ZITHROMAX Z-PAK) 250 MG tablet Take 2 tablets by mouth on day one followed by one tablet daily for 4 days., Print    benzonatate (TESSALON) 100 MG capsule Take 2 capsules (200 mg total) by mouth 3 (three) times  daily as needed., Starting Sun 09/16/2015, Print    promethazine-codeine (PHENERGAN WITH CODEINE) 6.25-10 MG/5ML syrup Take 5 mLs by mouth every 4 (four) hours as needed for cough., Starting Sun 09/16/2015, Print        I personally performed the services described in this documentation, which was scribed in my presence. The recorded information has been reviewed and is accurate.    Burgess Amor, PA-C 09/17/15 0630    Eber Hong, MD 09/19/15 1057

## 2016-04-08 ENCOUNTER — Emergency Department (HOSPITAL_COMMUNITY)
Admission: EM | Admit: 2016-04-08 | Discharge: 2016-04-08 | Disposition: A | Discharge status: Home / Self Care | Payer: Medicaid Other | Attending: Emergency Medicine | Admitting: Emergency Medicine

## 2016-04-08 DIAGNOSIS — F17210 Nicotine dependence, cigarettes, uncomplicated: Secondary | ICD-10-CM | POA: Insufficient documentation

## 2016-04-08 DIAGNOSIS — J449 Chronic obstructive pulmonary disease, unspecified: Secondary | ICD-10-CM | POA: Insufficient documentation

## 2016-04-08 DIAGNOSIS — G43109 Migraine with aura, not intractable, without status migrainosus: Secondary | ICD-10-CM

## 2016-04-08 MED ORDER — METOCLOPRAMIDE HCL 10 MG PO TABS: 5.0000 mg | ORAL_TABLET | Freq: Once | ORAL | Status: DC

## 2016-04-08 MED ORDER — DIPHENHYDRAMINE HCL 25 MG PO CAPS: 25.0000 mg | ORAL_CAPSULE | Freq: Once | ORAL | Status: DC

## 2016-04-08 MED ORDER — KETOROLAC TROMETHAMINE 30 MG/ML IJ SOLN: 15.0000 mg | Freq: Once | INTRAMUSCULAR | Status: DC

## 2016-04-08 NOTE — Discharge Instructions (Signed)
Please read attached information. If you experience any new or worsening signs or symptoms please return to the emergency room for evaluation. Please follow-up with your primary care provider or specialist as discussed.  °

## 2016-04-08 NOTE — ED Provider Notes (Signed)
[facility]-EMERGENCY DEPT Provider Note   CSN: [id number] Arrival date & time: 04/08/16  1322     History   Chief Complaint Chief Complaint  Patient presents with  . Headache  . Blurred Vision    HPI Lauren Mathis is a 46 y.o. female.  HPI   46 year old female presents today with complaints of headache.  Patient notes over the last several months she has had numerous headaches.  Patient reports migraines early on in life but none recently.  She reports symptoms started with blurred vision followed by generalized headache.  She notes the symptoms last anywhere from 10-20 minutes, and resolved completely on their own.  Patient denies any photophobia but reports that she closes her eyes when headaches come on.  She reports she has passing black spots through her visual field when symptoms come on.  She notes complete resolution of symptoms in the short duration of time.  She denies any other neurological deficits associated with the headaches.  Patient notes that from time to time she has had numbness and tingling in her feet bilateral, this comes and goes.  No fever or infectious etiology.  At the time of my evaluation patient reports symptoms are improving and minimal compared to initial onset.    Past Medical History:  Diagnosis Date  . COPD (chronic obstructive pulmonary disease) (HCC)     There are no active problems to display for this patient.   Past Surgical History:  Procedure Laterality Date  . TUBAL LIGATION      OB History    Gravida Para Term Preterm AB Living   3 3 3          SAB TAB Ectopic Multiple Live Births                   Home Medications    Prior to Admission medications   Medication Sig Start Date End Date Taking? Authorizing Provider  albuterol (PROVENTIL HFA;VENTOLIN HFA) 108 (90 BASE) MCG/ACT inhaler Inhale 2 puffs into the lungs every 6 (six) hours as needed for wheezing or shortness of breath.    Historical Provider, MD  azithromycin (ZITHROMAX  Z-PAK) 250 MG tablet Take 2 tablets by mouth on day one followed by one tablet daily for 4 days. 09/16/15   Burgess Amor, PA-C  benzonatate (TESSALON) 100 MG capsule Take 2 capsules (200 mg total) by mouth 3 (three) times daily as needed. 09/16/15   Burgess Amor, PA-C  fluticasone (FLONASE) 50 MCG/ACT nasal spray Place 2 sprays into both nostrils daily.    Historical Provider, MD  Fluticasone-Salmeterol (ADVAIR) 100-50 MCG/DOSE AEPB Inhale 1 puff into the lungs 2 (two) times daily.    Historical Provider, MD  folic acid (FOLVITE) 1 MG tablet Take 1 mg by mouth daily.    Historical Provider, MD  HYDROcodone-acetaminophen (NORCO/VICODIN) 5-325 MG per tablet Take one-two tabs po q 4-6 hrs prn pain Patient not taking: Reported on 10/20/2014 09/30/14   Tammy Triplett, PA-C  ibuprofen (ADVIL,MOTRIN) 200 MG tablet Take 800-1,000 mg by mouth every 6 (six) hours as needed for moderate pain.    Historical Provider, MD  naproxen (NAPROSYN) 500 MG tablet Take 1 tablet (500 mg total) by mouth 2 (two) times daily with a meal. Patient not taking: Reported on 10/20/2014 09/22/14   Tammy Triplett, PA-C  promethazine-codeine (PHENERGAN WITH CODEINE) 6.25-10 MG/5ML syrup Take 5 mLs by mouth every 4 (four) hours as needed for cough. 09/16/15   Burgess Amor, PA-C  Family History Family History  Problem Relation Age of Onset  . Asthma Mother   . Asthma Father     Social History Social History  Substance Use Topics  . Smoking status: Current Every Day Smoker    Packs/day: 1.00    Types: Cigarettes  . Smokeless tobacco: Never Used  . Alcohol use Yes     Comment: occas     Allergies   Patient has no known allergies.   Review of Systems Review of Systems  All other systems reviewed and are negative.    Physical Exam Updated Vital Signs BP 164/94 (BP Location: Right Arm)   Pulse 65   Temp 98.3 F (36.8 C) (Oral)   SpO2 99%   Physical Exam  Constitutional: She is oriented to person, place, and time. She  appears well-developed and well-nourished.  HENT:  Head: Normocephalic and atraumatic.  Eyes: Conjunctivae are normal. Pupils are equal, round, and reactive to light. Right eye exhibits no discharge. Left eye exhibits no discharge. No scleral icterus.  Neck: Normal range of motion. No JVD present. No tracheal deviation present.  Pulmonary/Chest: Effort normal. No stridor.  Neurological: She is alert and oriented to person, place, and time. No cranial nerve deficit or sensory deficit. Coordination normal. GCS eye subscore is 4. GCS verbal subscore is 5. GCS motor subscore is 6.  Psychiatric: She has a normal mood and affect. Her behavior is normal. Judgment and thought content normal.  Nursing note and vitals reviewed.    ED Treatments / Results  Labs (all labs ordered are listed, but only abnormal results are displayed) Labs Reviewed - No data to display  EKG  EKG Interpretation None       Radiology No results found.  Procedures Procedures (including critical care time)  Medications Ordered in ED Medications  ketorolac (TORADOL) 30 MG/ML injection 15 mg (not administered)  metoCLOPramide (REGLAN) tablet 5 mg (not administered)  diphenhydrAMINE (BENADRYL) capsule 25 mg (not administered)     Initial Impression / Assessment and Plan / ED Course  I have reviewed the triage vital signs and the nursing notes.  Pertinent labs & imaging results that were available during my care of the patient were reviewed by me and considered in my medical decision making (see chart for details).     Final Clinical Impressions(s) / ED Diagnoses   Final diagnoses:  Migraine with aura and without status migrainosus, not intractable     Labs:   Imaging:  Consults:  Therapeutics:  Discharge Meds:   Assessment/Plan: 46 year old female presents today with headache.  Upon initial evaluation she reports symptoms are improving, has no neurological deficits.  Patient does have new onset  of headaches over the last several months.  Patient was very calm and cooperative throughout evaluation, we discussed treating her pain here with a CT scan to rule out any space-occupying lesion.  After returning to my desk patient's family arrived, she immediately spoke with nursing staff reporting that she wanted to have IV taken out and to be discharged.  I spoke the patient reports that she is starting to feel anxious and does not want to be in the emergency room anymore.  I recommend patient stay for CT scan, she reports she does not want to stay for the scan or medication.  She is otherwise stable, I encouraged her to follow-up with neurology for further evaluation and management.  Patient understood the risks of leaving without proper evaluation, she verbalized understanding to return precautions, and  had no further questions or concerns at time of discharge     New Prescriptions New Prescriptions   No medications on file     Eyvonne MechanicJeffrey Syrus Nakama, PA-C 04/08/16 1417    Charlynne Panderavid Hsienta Yao, MD 04/08/16 [phone]

## 2016-04-08 NOTE — ED Triage Notes (Signed)
Patient transported via [facility]. Patient reports a history of recurrent headaches and blurred vision that have been occurring over the past month (approx.). Patient denies other symptoms such as weakness in extremities, dizziness, nausea, or vomiting. Patient's co-worker called EMS after she began reporting severe (8/10) pain on the left side of her head and blurred vision. Initially BP with EMS was 190/106 but decreased to 146/86 at the end of transport.

## 2016-04-09 ENCOUNTER — Emergency Department (HOSPITAL_COMMUNITY): Payer: Self-pay

## 2016-04-09 ENCOUNTER — Encounter (HOSPITAL_COMMUNITY): Payer: Self-pay | Admitting: Cardiology

## 2016-04-09 ENCOUNTER — Emergency Department (HOSPITAL_COMMUNITY)
Admission: EM | Admit: 2016-04-09 | Discharge: 2016-04-09 | Disposition: A | Discharge status: Home / Self Care | Payer: Self-pay | Attending: Emergency Medicine | Admitting: Emergency Medicine

## 2016-04-09 DIAGNOSIS — J449 Chronic obstructive pulmonary disease, unspecified: Secondary | ICD-10-CM | POA: Insufficient documentation

## 2016-04-09 DIAGNOSIS — F17210 Nicotine dependence, cigarettes, uncomplicated: Secondary | ICD-10-CM | POA: Insufficient documentation

## 2016-04-09 DIAGNOSIS — R51 Headache: Secondary | ICD-10-CM | POA: Insufficient documentation

## 2016-04-09 DIAGNOSIS — Z79899 Other long term (current) drug therapy: Secondary | ICD-10-CM | POA: Insufficient documentation

## 2016-04-09 DIAGNOSIS — R519 Headache, unspecified: Secondary | ICD-10-CM

## 2016-04-09 HISTORY — DX: Migraine, unspecified, not intractable, without status migrainosus: G43.909

## 2016-04-09 LAB — I-STAT CHEM 8, ED
BUN: 5 mg/dL — ABNORMAL LOW (ref 6–20)
CREATININE: 0.9 mg/dL (ref 0.44–1.00)
Calcium, Ion: 1.07 mmol/L — ABNORMAL LOW (ref 1.15–1.40)
Chloride: 103 mmol/L (ref 101–111)
Glucose, Bld: 126 mg/dL — ABNORMAL HIGH (ref 65–99)
HCT: 51 % — ABNORMAL HIGH (ref 36.0–46.0)
HEMOGLOBIN: 17.3 g/dL — AB (ref 12.0–15.0)
POTASSIUM: 3.5 mmol/L (ref 3.5–5.1)
Sodium: 139 mmol/L (ref 135–145)
TCO2: 25 mmol/L (ref 0–100)

## 2016-04-09 LAB — I-STAT BETA HCG BLOOD, ED (MC, WL, AP ONLY)

## 2016-04-09 MED ORDER — METOCLOPRAMIDE HCL 5 MG/ML IJ SOLN
10.0000 mg | Freq: Once | INTRAMUSCULAR | Status: AC
  Administered 2016-04-09: 10 mg via INTRAVENOUS
  Filled 2016-04-09: qty 2

## 2016-04-09 MED ORDER — DIPHENHYDRAMINE HCL 50 MG/ML IJ SOLN
50.0000 mg | Freq: Once | INTRAMUSCULAR | Status: AC
  Administered 2016-04-09: 50 mg via INTRAVENOUS
  Filled 2016-04-09: qty 1

## 2016-04-09 MED ORDER — KETOROLAC TROMETHAMINE 30 MG/ML IJ SOLN
30.0000 mg | Freq: Once | INTRAMUSCULAR | Status: AC
  Administered 2016-04-09: 30 mg via INTRAVENOUS
  Filled 2016-04-09: qty 1

## 2016-04-09 MED ORDER — METOCLOPRAMIDE HCL 10 MG PO TABS: 10.0000 mg | ORAL_TABLET | Freq: Four times a day (QID) | ORAL | 0 refills | Status: DC | PRN

## 2016-04-09 MED ORDER — HYDROCHLOROTHIAZIDE 12.5 MG PO TABS: 12.5000 mg | ORAL_TABLET | Freq: Every day | ORAL | 0 refills | Status: DC

## 2016-04-09 NOTE — ED Provider Notes (Signed)
[facility]-EMERGENCY DEPT Provider Note   CSN: [id number] Arrival date & time: 04/09/16  0732     History   Chief Complaint Chief Complaint  Patient presents with  . Blurred Vision    HPI Lauren Mathis is a 46 y.o. female.  HPI  Pt was seen at 0755. Per pt, c/o gradual onset and persistence of multiple intermittent episodes of "headache" for the past several months. Pt states her symptoms start with visual changes ("wavy lines"), followed by headache. Pt states "sometimes" she will take tylenol or motrin for the headache, but the headaches usually go away on their own. Endorses hx of similar headaches pattern "years ago." States yesterday she went to the ED for evaluation when a co-worker called EMS regarding her symptoms. Pt states she was told her "BP was high" at that time. Pt left AMA from the ED. Pt states she took her BP again this morning when she woke up and "it was high again."  Denies headache was sudden or maximal in onset or at any time.  Denies visual loss, no eye pain, no focal motor weakness, no tingling/numbness in extremities, no slurred speech, no ataxia, no facial droop, no fevers, no neck pain, no rash.  Denies N/V/D, no CP/SOB, no abd pain.    Past Medical History:  Diagnosis Date  . COPD (chronic obstructive pulmonary disease) (HCC)   . Migraine headache     There are no active problems to display for this patient.   Past Surgical History:  Procedure Laterality Date  . TUBAL LIGATION      OB History    Gravida Para Term Preterm AB Living   3 3 3          SAB TAB Ectopic Multiple Live Births                   Home Medications    Prior to Admission medications   Medication Sig Start Date End Date Taking? Authorizing Provider  albuterol (PROVENTIL HFA;VENTOLIN HFA) 108 (90 BASE) MCG/ACT inhaler Inhale 2 puffs into the lungs every 6 (six) hours as needed for wheezing or shortness of breath.    Historical Provider, MD  azithromycin (ZITHROMAX Z-PAK) 250  MG tablet Take 2 tablets by mouth on day one followed by one tablet daily for 4 days. 09/16/15   Burgess Amor, PA-C  benzonatate (TESSALON) 100 MG capsule Take 2 capsules (200 mg total) by mouth 3 (three) times daily as needed. 09/16/15   Burgess Amor, PA-C  fluticasone (FLONASE) 50 MCG/ACT nasal spray Place 2 sprays into both nostrils daily.    Historical Provider, MD  Fluticasone-Salmeterol (ADVAIR) 100-50 MCG/DOSE AEPB Inhale 1 puff into the lungs 2 (two) times daily.    Historical Provider, MD  folic acid (FOLVITE) 1 MG tablet Take 1 mg by mouth daily.    Historical Provider, MD  HYDROcodone-acetaminophen (NORCO/VICODIN) 5-325 MG per tablet Take one-two tabs po q 4-6 hrs prn pain Patient not taking: Reported on 10/20/2014 09/30/14   Tammy Triplett, PA-C  ibuprofen (ADVIL,MOTRIN) 200 MG tablet Take 800-1,000 mg by mouth every 6 (six) hours as needed for moderate pain.    Historical Provider, MD  naproxen (NAPROSYN) 500 MG tablet Take 1 tablet (500 mg total) by mouth 2 (two) times daily with a meal. Patient not taking: Reported on 10/20/2014 09/22/14   Tammy Triplett, PA-C  promethazine-codeine (PHENERGAN WITH CODEINE) 6.25-10 MG/5ML syrup Take 5 mLs by mouth every 4 (four) hours as needed for cough. 09/16/15  Burgess Amor, PA-C    Family History Family History  Problem Relation Age of Onset  . Asthma Mother   . Asthma Father     Social History Social History  Substance Use Topics  . Smoking status: Current Every Day Smoker    Packs/day: 1.00    Types: Cigarettes  . Smokeless tobacco: Never Used  . Alcohol use Yes     Comment: occas     Allergies   Patient has no known allergies.   Review of Systems Review of Systems ROS: Statement: All systems negative except as marked or noted in the HPI; Constitutional: Negative for fever and chills. ; ; Eyes: +"wavy lines" in vision. Negative for eye pain, redness and discharge. ; ; ENMT: Negative for ear pain, hoarseness, nasal congestion, sinus  pressure and sore throat. ; ; Cardiovascular: Negative for chest pain, palpitations, diaphoresis, dyspnea and peripheral edema. ; ; Respiratory: Negative for cough, wheezing and stridor. ; ; Gastrointestinal: Negative for nausea, vomiting, diarrhea, abdominal pain, blood in stool, hematemesis, jaundice and rectal bleeding. . ; ; Genitourinary: Negative for dysuria, flank pain and hematuria. ; ; Musculoskeletal: Negative for back pain and neck pain. Negative for swelling and trauma.; ; Skin: Negative for pruritus, rash, abrasions, blisters, bruising and skin lesion.; ; Neuro: +headache. Negative for lightheadedness and neck stiffness. Negative for weakness, altered level of consciousness, altered mental status, extremity weakness, paresthesias, involuntary movement, seizure and syncope.      Physical Exam Updated Vital Signs BP (!) 186/117   Pulse 79   Temp 98.1 F (36.7 C) (Oral)   Resp 16   Ht 5\' 3"  (1.6 m)   Wt 110 lb (49.9 kg)   LMP 07/17/2015   SpO2 98%   BMI 19.49 kg/m    BP 137/95   Pulse 81   Temp 98.1 F (36.7 C) (Oral)   Resp 18   Ht 5\' 3"  (1.6 m)   Wt 110 lb (49.9 kg)   LMP 07/17/2015   SpO2 99%   BMI 19.49 kg/m    Patient Vitals for the past 24 hrs:  BP Temp Temp src Pulse Resp SpO2 Height Weight  04/09/16 1130 146/100 - - (!) 50 18 100 % - -  04/09/16 0930 128/94 - - 66 18 96 % - -  04/09/16 0840 179/95 - - 76 18 96 % - -  04/09/16 0744 (!) 186/117 98.1 F (36.7 C) Oral 79 16 98 % 5\' 3"  (1.6 m) 110 lb (49.9 kg)     Physical Exam 0800: Physical examination:  Nursing notes reviewed; Vital signs and O2 SAT reviewed;  Constitutional: Well developed, Well nourished, Well hydrated, In no acute distress; Head:  Normocephalic, atraumatic; Eyes: EOMI, PERRL, No scleral icterus; ENMT: TM's clear bilat. Mouth and pharynx normal, Mucous membranes moist; Neck: Supple, Full range of motion, No lymphadenopathy; Cardiovascular: Regular rate and rhythm, No gallop; Respiratory:  Breath sounds clear & equal bilaterally, No wheezes.  Speaking full sentences with ease, Normal respiratory effort/excursion; Chest: Nontender, Movement normal; Abdomen: Soft, Nontender, Nondistended, Normal bowel sounds; Genitourinary: No CVA tenderness; Spine:  No midline CS, TS, LS tenderness.;; Extremities: Pulses normal, No tenderness, No edema, No calf edema or asymmetry.; Neuro: AA&Ox3, Major CN grossly intact. No facial droop. Speech clear. No gross focal motor or sensory deficits in extremities.; Skin: Color normal, Warm, Dry.; Psych:  Affect flat, poor eye contact.    ED Treatments / Results  Labs (all labs ordered are listed, but only abnormal results  are displayed)   EKG  EKG Interpretation None       Radiology   Procedures Procedures (including critical care time)  Medications Ordered in ED Medications  ketorolac (TORADOL) 30 MG/ML injection 30 mg (30 mg Intravenous Given 04/09/16 0818)  metoCLOPramide (REGLAN) injection 10 mg (10 mg Intravenous Given 04/09/16 0818)  diphenhydrAMINE (BENADRYL) injection 50 mg (50 mg Intravenous Given 04/09/16 0818)     Initial Impression / Assessment and Plan / ED Course  I have reviewed the triage vital signs and the nursing notes.  Pertinent labs & imaging results that were available during my care of the patient were reviewed by me and considered in my medical decision making (see chart for details).  MDM Reviewed: previous chart, nursing note and vitals Reviewed previous: CT scan Interpretation: labs and CT scan   Results for orders placed or performed during the hospital encounter of 04/09/16  I-Stat beta hCG blood, ED  Result Value Ref Range   I-stat hCG, quantitative <5.0 <5 mIU/mL   Comment 3          I-stat Chem 8, ED  Result Value Ref Range   Sodium 139 135 - 145 mmol/L   Potassium 3.5 3.5 - 5.1 mmol/L   Chloride 103 101 - 111 mmol/L   BUN 5 (L) 6 - 20 mg/dL   Creatinine, Ser 1.61 0.44 - 1.00 mg/dL   Glucose,  Bld 096 (H) 65 - 99 mg/dL   Calcium, Ion 0.45 (L) 1.15 - 1.40 mmol/L   TCO2 25 0 - 100 mmol/L   Hemoglobin 17.3 (H) 12.0 - 15.0 g/dL   HCT 40.9 (H) 81.1 - 91.4 %   Ct Head Wo Contrast Result Date: 04/09/2016 CLINICAL DATA:  Blurred vision over the last hour. Headache beginning this morning. Hypertensive yesterday. EXAM: CT HEAD WITHOUT CONTRAST TECHNIQUE: Contiguous axial images were obtained from the base of the skull through the vertex without intravenous contrast. COMPARISON:  04/02/2009 FINDINGS: Brain: No sign of acute infarction, mass lesion, hemorrhage, hydrocephalus or extra-axial collection. 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 This was not present on the study of 2007. Vascular: No abnormal vascular finding. Skull: Normal Sinuses/Orbits: Clear/normal Other: None IMPRESSION: No acute finding. 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 Electronically Signed   By: Paulina Fusi M.D.   On: 04/09/2016 08:55   Mr Brain Wo Contrast (neuro Protocol) Result Date: 04/09/2016 CLINICAL DATA:  Blurred vision and headache for 1 day. Abnormal head CT. EXAM: MRI HEAD WITHOUT CONTRAST TECHNIQUE: Multiplanar, multiecho pulse sequences of the brain and surrounding structures were obtained without intravenous contrast. COMPARISON:  Head CT from earlier today FINDINGS: Brain: Small remote cortex infarct in the low left parietal lobe. No acute infarct, hemorrhage, hydrocephalus, or mass. 2 FLAIR hyperintensities in the frontal white matter, likely nonspecific microvascular insults. Clear suprasellar cistern. Vascular: Normal flow voids. Skull and upper cervical spine: Normal marrow signal. Sinuses/Orbits: Negative. IMPRESSION: 1. No acute finding. 2. Small remote left parietal cortex infarct. Electronically Signed   By: Marnee Spring M.D.   On: 04/09/2016 11:03    1155:  Workup as  above. Feels better after meds; is currently texting on cellphone without difficulty or distress. Has tol PO well while in the ED without N/V. Neuro exam remains intact and unchanged. Given old CVA on MRI/CT, and pt did not know, will tx  BP today (pt and family now aware of this finding). Tx symptomatically for headache, f/u Neuro MD. Dx and testing d/w pt and family.  Questions answered.  Verb understanding, agreeable to d/c home with outpt f/u.    Final Clinical Impressions(s) / ED Diagnoses   Final diagnoses:  None    New Prescriptions New Prescriptions   No medications on file     Samuel JesterKathleen Collin Rengel, DO 04/13/16 1511

## 2016-04-09 NOTE — Discharge Instructions (Signed)
Take over the counter tylenol, ibuprofen and benadryl, as directed on packaging, with the reglan prescription given to you today, as needed for headache.  Keep a headache diary, as discussed. Take your usual prescriptions as previously directed.  Take your blood pressure only once per day, a few times per week, either in the morning approximately 1 hour after you take your medicine(s) or in the evening before you go to bed.  Always sit quietly for at least 15 to 20 minutes before taking your blood pressure.  Keep a diary of your blood pressures to show your doctor at your follow up office visit. Call your regular medical doctor today to schedule a follow up appointment within the next 2 days.Call the Neurologist today to schedule a follow up appointment within the next 3 days.  Return to the Emergency Department immediately sooner if worsening.

## 2016-04-09 NOTE — ED Triage Notes (Addendum)
Blurry vision times one hour.  C/o headache since this morning .  [facility] for [facility].  States she didn't feel right at work yesterday and they checked her blood pressure and it was high and she was sent to the er.

## 2016-04-09 NOTE — ED Notes (Signed)
ED Provider at bedside. 

## 2017-02-24 ENCOUNTER — Emergency Department (HOSPITAL_COMMUNITY): Payer: Medicaid Other

## 2017-02-24 ENCOUNTER — Emergency Department (HOSPITAL_COMMUNITY)
Admission: EM | Admit: 2017-02-24 | Discharge: 2017-02-24 | Disposition: A | Discharge status: Home / Self Care | Payer: Medicaid Other | Attending: Emergency Medicine | Admitting: Emergency Medicine

## 2017-02-24 ENCOUNTER — Encounter (HOSPITAL_COMMUNITY): Payer: Self-pay | Admitting: Emergency Medicine

## 2017-02-24 DIAGNOSIS — F17210 Nicotine dependence, cigarettes, uncomplicated: Secondary | ICD-10-CM | POA: Insufficient documentation

## 2017-02-24 DIAGNOSIS — M5412 Radiculopathy, cervical region: Secondary | ICD-10-CM | POA: Diagnosis not present

## 2017-02-24 DIAGNOSIS — J449 Chronic obstructive pulmonary disease, unspecified: Secondary | ICD-10-CM | POA: Insufficient documentation

## 2017-02-24 DIAGNOSIS — M25512 Pain in left shoulder: Secondary | ICD-10-CM | POA: Diagnosis present

## 2017-02-24 MED ORDER — CYCLOBENZAPRINE HCL 10 MG PO TABS: 10.0000 mg | ORAL_TABLET | Freq: Three times a day (TID) | ORAL | 0 refills | Status: DC | PRN

## 2017-02-24 MED ORDER — HYDROCODONE-ACETAMINOPHEN 5-325 MG PO TABS
1.0000 | ORAL_TABLET | Freq: Once | ORAL | Status: AC
  Administered 2017-02-24: 1 via ORAL
  Filled 2017-02-24: qty 1

## 2017-02-24 MED ORDER — OXYCODONE-ACETAMINOPHEN 5-325 MG PO TABS: 1.0000 | ORAL_TABLET | ORAL | 0 refills | Status: DC | PRN

## 2017-02-24 MED ORDER — OXYCODONE-ACETAMINOPHEN 5-325 MG PO TABS
1.0000 | ORAL_TABLET | Freq: Once | ORAL | Status: AC
  Administered 2017-02-24: 1 via ORAL
  Filled 2017-02-24: qty 1

## 2017-02-24 MED ORDER — PREDNISONE 10 MG PO TABS: ORAL_TABLET | ORAL | 0 refills | Status: DC

## 2017-02-24 MED ORDER — KETOROLAC TROMETHAMINE 60 MG/2ML IM SOLN
60.0000 mg | Freq: Once | INTRAMUSCULAR | Status: AC
  Administered 2017-02-24: 60 mg via INTRAMUSCULAR
  Filled 2017-02-24: qty 2

## 2017-02-24 NOTE — Discharge Instructions (Addendum)
Followup with [facility] at [phone] -  Ingrid  Alternate ice and heat to your neck.  You can contact one of the providers listed to establish primary care.  Call Dr. Yetta BarreJones office to arrange a follow-up appt once regarding the pinched nerve in your neck.  Return to ER for any worsening symptoms   [facility] Doctor List    Kari BaarsEdward Hawkins MD. Specialty: Pulmonary Disease Contact information: [address]  [address]  [place] [address]  [phone]   Syliva OvermanMargaret Simpson, MD. Specialty: [facility] Contact information: [address]  [place] [address]  [phone]   Lilyan PuntScott Luking, MD. Specialty: [facility] Contact information: [address]  [address]  [place] [address]  [phone]   Avon Gullyesfaye Fanta, MD Specialty: Internal Medicine Contact information: [address]   [place] [address]  [phone]   Catalina PizzaZach Hall, MD. Specialty: Internal Medicine Contact information: [address]  [place] [address]  [phone]    [facility] (Dr. Selena BattenKim) Specialty: Family Medicine Contact information: [address]  [place] [address]  [phone]   John GiovanniStephen Knowlton, MD. Specialty: [facility] Contact information: [address]  [address]  [place] [address]  [phone]   Carylon Perchesoy Fagan, MD. Specialty: Internal Medicine Contact information: [address]  [address]  [place] [address]  [phone]    [facility]  [address] [place] [address] [phone]  Services [facility] offers a variety of basic health services.  Services include but are not limited to: Blood pressure checks  Heart rate checks  Blood sugar checks  Urine analysis  Rapid strep tests  Pregnancy tests.  Health education and referrals  People needing more complex services will be directed to a physician online.  Using these virtual visits, doctors can evaluate and prescribe medicine and treatments. There will be no medication on-site, though [facility] will help patients fill their prescriptions at little to no cost.   For More information please go to: [email or web address]

## 2017-02-24 NOTE — ED Provider Notes (Signed)
[facility] EMERGENCY DEPARTMENT Provider Note   CSN: [id number] Arrival date & time: 02/24/17  4098     History   Chief Complaint Chief Complaint  Patient presents with  . Shoulder Pain    HPI Lauren Mathis is a 47 y.o. female.  HPI   Lauren Mathis is a 47 y.o. female who presents to the Emergency Department complaining of persistent left shoulder and neck pain for 1 month.  She describes a constant, throbbing pain to her left neck that radiates into her left shoulder and down her arm.  Pain is associated with movement of the left arm and turning her head to the left.  She also describes tingling and intermittent numbness to her arm.  She has difficulty with holding objects in the left hand.  She denies known injury.  She has been taking over-the-counter anti-inflammatories with minimal to no relief.  She denies chest pain, nausea, vomiting, swelling or rash.  No history of coronary artery disease.  No facial symptoms.   Past Medical History:  Diagnosis Date  . COPD (chronic obstructive pulmonary disease) (HCC)   . Migraine headache     There are no active problems to display for this patient.   Past Surgical History:  Procedure Laterality Date  . TUBAL LIGATION      OB History    Gravida Para Term Preterm AB Living   3 3 3          SAB TAB Ectopic Multiple Live Births                   Home Medications    Prior to Admission medications   Medication Sig Start Date End Date Taking? Authorizing Provider  azithromycin (ZITHROMAX Z-PAK) 250 MG tablet Take 2 tablets by mouth on day one followed by one tablet daily for 4 days. Patient not taking: Reported on 04/09/2016 09/16/15   Burgess Amor, PA-C  benzonatate (TESSALON) 100 MG capsule Take 2 capsules (200 mg total) by mouth 3 (three) times daily as needed. Patient not taking: Reported on 04/09/2016 09/16/15   Burgess Amor, PA-C  hydrochlorothiazide (HYDRODIURIL) 12.5 MG tablet Take 1 tablet (12.5 mg total) by mouth  daily. 04/09/16   Samuel Jester, DO  ibuprofen (ADVIL,MOTRIN) 200 MG tablet Take 800-1,000 mg by mouth every 6 (six) hours as needed for moderate pain.    [provider]  metoCLOPramide (REGLAN) 10 MG tablet Take 1 tablet (10 mg total) by mouth every 6 (six) hours as needed for nausea (or headache). 04/09/16   Samuel Jester, DO    Family History Family History  Problem Relation Age of Onset  . Asthma Mother   . Asthma Father     Social History Social History   Tobacco Use  . Smoking status: Current Every Day Smoker    Packs/day: 2.00    Types: Cigarettes  . Smokeless tobacco: Never Used  Substance Use Topics  . Alcohol use: Yes    Comment: occas  . Drug use: No     Allergies   Patient has no known allergies.   Review of Systems Review of Systems  Constitutional: Negative for chills and fever.  Eyes: Negative for visual disturbance.  Respiratory: Negative for chest tightness and shortness of breath.   Cardiovascular: Negative for chest pain.  Gastrointestinal: Negative for abdominal pain, nausea and vomiting.  Genitourinary: Negative for difficulty urinating and dysuria.  Musculoskeletal: Positive for arthralgias and neck pain. Negative for joint swelling.  Skin: Negative  for color change and wound.  Neurological: Positive for numbness (numbness and tingling to left arm). Negative for dizziness, syncope, facial asymmetry and speech difficulty.  Psychiatric/Behavioral: Negative for confusion.  All other systems reviewed and are negative.    Physical Exam Updated Vital Signs BP 137/82 (BP Location: Right Arm)   Pulse 65   Temp 98.1 F (36.7 C) (Oral)   Resp 16   Ht 5\' 3"  (1.6 m)   Wt 45.8 kg (101 lb)   LMP 07/17/2015   SpO2 100%   BMI 17.89 kg/m   Physical Exam  Constitutional: She is oriented to person, place, and time. She appears well-developed and well-nourished. No distress.  HENT:  Head: Normocephalic and atraumatic.  Mouth/Throat:  Oropharynx is clear and moist.  Eyes: EOM are normal. Pupils are equal, round, and reactive to light.  Neck: Phonation normal. Muscular tenderness present. No spinous process tenderness present. No neck rigidity. No tracheal deviation, no erythema and normal range of motion present. No Brudzinski's sign and no Kernig's sign noted. No thyromegaly present.  Cardiovascular: Normal rate, regular rhythm and intact distal pulses.  Radial pulses are strong and palpable bilaterally  Pulmonary/Chest: Effort normal and breath sounds normal. No respiratory distress. She exhibits no tenderness.  Musculoskeletal: She exhibits tenderness. She exhibits no edema.       Cervical back: She exhibits tenderness. She exhibits normal range of motion, no bony tenderness, no swelling, no deformity, no spasm and normal pulse.  ttp of the lower cervical spine and left cervical paraspinal muscles and along the left trapezius muscle. Pain reproduced with abduction.  Grip strength is 4/5 on left.     Lymphadenopathy:    She has no cervical adenopathy.  Neurological: She is alert and oriented to person, place, and time. She has normal strength. No sensory deficit. She exhibits normal muscle tone. Coordination normal.  Reflex Scores:      Tricep reflexes are 2+ on the right side and 2+ on the left side.      Bicep reflexes are 2+ on the right side and 2+ on the left side. CN III-XII grossly intact  Skin: Skin is warm and dry. Capillary refill takes less than 2 seconds. No rash noted.  Psychiatric: She has a normal mood and affect.  Nursing note and vitals reviewed.    ED Treatments / Results  Labs (all labs ordered are listed, but only abnormal results are displayed) Labs Reviewed - No data to display  EKG  EKG Interpretation None       Radiology Ct Cervical Spine Wo Contrast  Result Date: 02/24/2017 CLINICAL DATA:  Left-sided neck pain for the past month. No known injury. EXAM: CT CERVICAL SPINE WITHOUT  CONTRAST TECHNIQUE: Multidetector CT imaging of the cervical spine was performed without intravenous contrast. Multiplanar CT image reconstructions were also generated. COMPARISON:  None. FINDINGS: Alignment: Normal. Skull base and vertebrae: No acute fracture. No primary bone lesion or focal pathologic process. Soft tissues and spinal canal: No prevertebral fluid or swelling. No visible canal hematoma. Disc levels: Moderate disc bulge at C5-C6, asymmetric to the left, mildly narrowing the central spinal canal and moderately narrowing the left neural foramen. Upper chest: Mild centrilobular emphysema. Other: None. IMPRESSION: 1. Moderate disc bulge at C5-C6 narrowing the central spinal canal and left neural foramen, likely impinging on the exiting C6 nerve root. Electronically Signed   By: Obie Dredge M.D.   On: 02/24/2017 11:41   Dg Shoulder Left  Result Date: 02/24/2017 CLINICAL  DATA:  Left shoulder pain and numbness for the past month. EXAM: LEFT SHOULDER - 2+ VIEW COMPARISON:  Left shoulder x-rays dated September 08, 2005. FINDINGS: No acute fracture or malalignment. Mild acromioclavicular joint space narrowing. The glenohumeral joint space is preserved. Soft tissues are unremarkable. IMPRESSION: No acute osseous abnormality. Mild acromioclavicular osteoarthritis. Electronically Signed   By: Obie DredgeWilliam T Derry M.D.   On: 02/24/2017 11:17    Procedures Procedures (including critical care time)  Medications Ordered in ED Medications  ketorolac (TORADOL) injection 60 mg (not administered)  HYDROcodone-acetaminophen (NORCO/VICODIN) 5-325 MG per tablet 1 tablet (not administered)     Initial Impression / Assessment and Plan / ED Course  I have reviewed the triage vital signs and the nursing notes.  Pertinent labs & imaging results that were available during my care of the patient were reviewed by me and considered in my medical decision making (see chart for details).      Pt with cervical  radicular pain.  NV intact.  Mild motor weakness of left arm.   Doubt cardiac process. Discussed CT findings, pt states she doesn't have PCP or insurance at present.    1240  Consulted case management, Raechel ChuteSarah Brown,who agrees to provide pt with info and paperwork to initiate financial assistance and to establish PCP.  Referral information given for neurosurgery.  Strict return precautions discussed.   Final Clinical Impressions(s) / ED Diagnoses   Final diagnoses:  Cervical radiculopathy    ED Discharge Orders    None       Rosey Bathriplett, Dorothy Landgrebe, PA-C 02/24/17 2144    Raeford RazorKohut, Stephen, MD 02/25/17 [phone]

## 2017-02-24 NOTE — Care Management Note (Addendum)
Case Management Note  Patient Details  Name: Lauren Mathis MRN: [id number] Date of Birth: 01/22/1971  Subjective/Objective:                    Action/Plan:   Expected Discharge Date:                  Expected Discharge Plan:     In-House Referral:   Patient needs to followup with neuro surgery - has no insurance.  Called and left two messages with Financial Counselor to contact patient.  Left verified return number to call patient.  Talked with patient and also gave her financial number to call to assist with starting the process.  Also has no PCP - discussed physicians in [place] that are taking new patients but also would be a cost.  Would like to get insurance first.  Patient very thankful for any assistance she can get.      Discharge planning Services     Post Acute Care Choice:    Choice offered to:     DME Arranged:    DME Agency:     HH Arranged:    HH Agency:     Status of Service:     If discussed at [employer] Length of Stay Meetings, dates discussed:    Additional Comments:  Vangie BickerBrown, [name], RN 02/24/2017, 12:49 PM

## 2017-02-24 NOTE — ED Triage Notes (Signed)
Pt reports left shoulder pain and arm numbness x 1 month with no injury.

## 2017-02-26 IMAGING — DX DG TOE 5TH 2+V*R*
3 series · 3 of 3 positions shown · non-contrast
Comparison: None.

CLINICAL DATA: Pain in pinky toe.  Discoloration.

EXAM:
RIGHT FIFTH TOE

[toe ap]
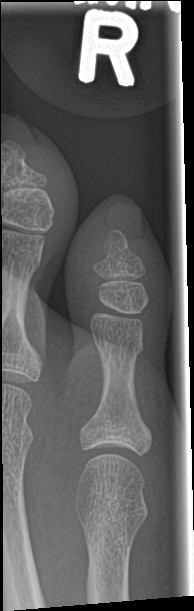

[toe obl]
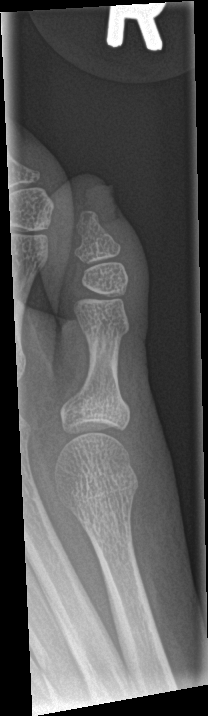

[toe lat]
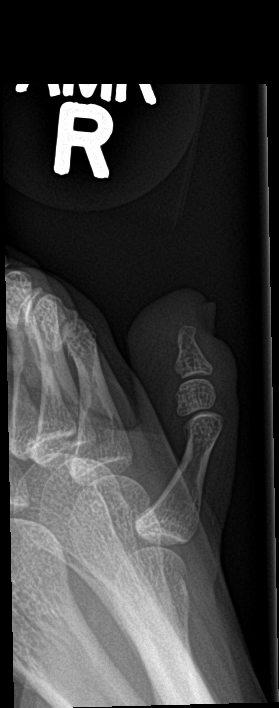

[3 of 3 positions shown; findings below may reference images not displayed]

FINDINGS: Soft tissue swelling is present about the fifth digit. There is no
underlying fracture. No osseous erosion is seen. The joints are
located.
IMPRESSION: Soft tissue swelling in the fifth digit without acute or focal
osseous abnormality. This may be related to 2 cellulitis or
posttraumatic edema.

## 2017-03-27 ENCOUNTER — Emergency Department (HOSPITAL_COMMUNITY): Payer: Medicaid Other

## 2017-03-27 ENCOUNTER — Other Ambulatory Visit: Payer: Self-pay

## 2017-03-27 ENCOUNTER — Encounter (HOSPITAL_COMMUNITY): Payer: Self-pay | Admitting: Emergency Medicine

## 2017-03-27 ENCOUNTER — Emergency Department (HOSPITAL_COMMUNITY)
Admission: EM | Admit: 2017-03-27 | Discharge: 2017-03-27 | Disposition: A | Discharge status: Home / Self Care | Payer: Medicaid Other | Attending: Emergency Medicine | Admitting: Emergency Medicine

## 2017-03-27 DIAGNOSIS — F17210 Nicotine dependence, cigarettes, uncomplicated: Secondary | ICD-10-CM | POA: Diagnosis not present

## 2017-03-27 DIAGNOSIS — J449 Chronic obstructive pulmonary disease, unspecified: Secondary | ICD-10-CM | POA: Insufficient documentation

## 2017-03-27 DIAGNOSIS — R0602 Shortness of breath: Secondary | ICD-10-CM | POA: Diagnosis present

## 2017-03-27 DIAGNOSIS — J111 Influenza due to unidentified influenza virus with other respiratory manifestations: Secondary | ICD-10-CM | POA: Diagnosis not present

## 2017-03-27 DIAGNOSIS — R69 Illness, unspecified: Secondary | ICD-10-CM

## 2017-03-27 LAB — CBC WITH DIFFERENTIAL/PLATELET
BASOS PCT: 0 %
Basophils Absolute: 0.1 10*3/uL (ref 0.0–0.1)
EOS ABS: 0.1 10*3/uL (ref 0.0–0.7)
EOS PCT: 1 %
HCT: 49.7 % — ABNORMAL HIGH (ref 36.0–46.0)
HEMOGLOBIN: 16.3 g/dL — AB (ref 12.0–15.0)
Lymphocytes Relative: 19 %
Lymphs Abs: 2.4 10*3/uL (ref 0.7–4.0)
MCH: 33.1 pg (ref 26.0–34.0)
MCHC: 32.8 g/dL (ref 30.0–36.0)
MCV: 101 fL — ABNORMAL HIGH (ref 78.0–100.0)
MONOS PCT: 9 %
Monocytes Absolute: 1.1 10*3/uL — ABNORMAL HIGH (ref 0.1–1.0)
NEUTROS PCT: 71 %
Neutro Abs: 8.9 10*3/uL — ABNORMAL HIGH (ref 1.7–7.7)
PLATELETS: 346 10*3/uL (ref 150–400)
RBC: 4.92 MIL/uL (ref 3.87–5.11)
RDW: 12.7 % (ref 11.5–15.5)
WBC: 12.6 10*3/uL — ABNORMAL HIGH (ref 4.0–10.5)

## 2017-03-27 LAB — BASIC METABOLIC PANEL
Anion gap: 15 (ref 5–15)
BUN: 8 mg/dL (ref 6–20)
CALCIUM: 10.1 mg/dL (ref 8.9–10.3)
CO2: 24 mmol/L (ref 22–32)
CREATININE: 0.96 mg/dL (ref 0.44–1.00)
Chloride: 95 mmol/L — ABNORMAL LOW (ref 101–111)
GFR calc Af Amer: >60 mL/min (ref >60)
GLUCOSE: 87 mg/dL (ref 65–99)
POTASSIUM: 2.9 mmol/L — AB (ref 3.5–5.1)
SODIUM: 134 mmol/L — AB (ref 135–145)

## 2017-03-27 LAB — TROPONIN I

## 2017-03-27 MED ORDER — ALBUTEROL (5 MG/ML) CONTINUOUS INHALATION SOLN
10.0000 mg/h | INHALATION_SOLUTION | RESPIRATORY_TRACT | Status: DC
  Administered 2017-03-27: 10 mg/h via RESPIRATORY_TRACT
  Filled 2017-03-27: qty 20

## 2017-03-27 MED ORDER — MORPHINE SULFATE (PF) 4 MG/ML IV SOLN
4.0000 mg | Freq: Once | INTRAVENOUS | Status: AC
  Administered 2017-03-27: 4 mg via INTRAVENOUS
  Filled 2017-03-27: qty 1

## 2017-03-27 MED ORDER — PREDNISONE 20 MG PO TABS: 40.0000 mg | ORAL_TABLET | Freq: Every day | ORAL | 0 refills | Status: DC

## 2017-03-27 MED ORDER — ALBUTEROL SULFATE (2.5 MG/3ML) 0.083% IN NEBU: 5.0000 mg | INHALATION_SOLUTION | Freq: Once | RESPIRATORY_TRACT | Status: DC

## 2017-03-27 MED ORDER — IPRATROPIUM-ALBUTEROL 0.5-2.5 (3) MG/3ML IN SOLN
3.0000 mL | Freq: Once | RESPIRATORY_TRACT | Status: AC
  Administered 2017-03-27: 3 mL via RESPIRATORY_TRACT
  Filled 2017-03-27: qty 3

## 2017-03-27 MED ORDER — KETOROLAC TROMETHAMINE 30 MG/ML IJ SOLN
15.0000 mg | Freq: Once | INTRAMUSCULAR | Status: AC
  Administered 2017-03-27: 15 mg via INTRAVENOUS
  Filled 2017-03-27: qty 1

## 2017-03-27 MED ORDER — CHLORPHENIRAMINE-PHENYLEPHRINE 1-3.5 MG/ML PO LIQD: 0.7500 mL | Freq: Four times a day (QID) | ORAL | 0 refills | Status: DC | PRN

## 2017-03-27 MED ORDER — METHYLPREDNISOLONE SODIUM SUCC 125 MG IJ SOLR
125.0000 mg | Freq: Once | INTRAMUSCULAR | Status: AC
  Administered 2017-03-27: 125 mg via INTRAVENOUS
  Filled 2017-03-27: qty 2

## 2017-03-27 NOTE — ED Provider Notes (Signed)
[facility] EMERGENCY DEPARTMENT Provider Note   CSN: [id number] Arrival date & time: 03/27/17  1047     History   Chief Complaint Chief Complaint  Patient presents with  . Shortness of Breath    HPI Lauren Mathis is a 47 y.o. female.  HPI Patient presents with concern of cough, wheezing, generalized discomfort, nausea, weakness. Onset was about 4 days ago, since onset symptoms been persistent in spite of using home albuterol, and her mother's nebulizer machine. No fever, no diarrhea, but otherwise, symptoms are generalized, with increasing diffuse discomfort. Patient has a history of COPD, is a longtime smoker. We discussed the importance of smoking cessation, given this severe illness.  Patient is here with her mother who assists with the HPI. Past Medical History:  Diagnosis Date  . COPD (chronic obstructive pulmonary disease) (HCC)   . Migraine headache     There are no active problems to display for this patient.   Past Surgical History:  Procedure Laterality Date  . TUBAL LIGATION      OB History    Gravida Para Term Preterm AB Living   3 3 3          SAB TAB Ectopic Multiple Live Births                   Home Medications    Prior to Admission medications   Medication Sig Start Date End Date Taking? Authorizing Provider  albuterol (PROVENTIL HFA;VENTOLIN HFA) 108 (90 Base) MCG/ACT inhaler Inhale 1-2 puffs into the lungs every 6 (six) hours as needed for wheezing or shortness of breath.   Yes [provider]  ibuprofen (ADVIL,MOTRIN) 200 MG tablet Take 1,000-1,200 mg by mouth every 6 (six) hours as needed for moderate pain.    Yes [provider]  oxyCODONE-acetaminophen (PERCOCET/ROXICET) 5-325 MG tablet Take 1 tablet by mouth every 4 (four) hours as needed. Patient not taking: Reported on 03/27/2017 02/24/17   Pauline Aus, PA-C  predniSONE (DELTASONE) 10 MG tablet Take 6 tablets day one, 5 tablets day two, 4 tablets day three, 3  tablets day four, 2 tablets day five, then 1 tablet day six Patient not taking: Reported on 03/27/2017 02/24/17   Pauline Aus, PA-C    Family History Family History  Problem Relation Age of Onset  . Asthma Mother   . Asthma Father     Social History Social History   Tobacco Use  . Smoking status: Current Every Day Smoker    Packs/day: 2.00    Types: Cigarettes  . Smokeless tobacco: Never Used  Substance Use Topics  . Alcohol use: Yes    Comment: occas  . Drug use: No     Allergies   Patient has no known allergies.   Review of Systems Review of Systems  Constitutional:       Per HPI, otherwise negative  HENT:       Per HPI, otherwise negative  Respiratory:       Per HPI, otherwise negative  Cardiovascular:       Per HPI, otherwise negative  Gastrointestinal: Positive for nausea. Negative for vomiting.  Endocrine:       Negative aside from HPI  Genitourinary:       Neg aside from HPI   Musculoskeletal:       Per HPI, otherwise negative  Skin: Negative.   Neurological: Positive for weakness. Negative for syncope.     Physical Exam Updated Vital Signs BP 128/76   Pulse Marland Kitchen)  107   Temp 98.3 F (36.8 C) (Oral)   Resp 16   Ht 5\' 3"  (1.6 m)   Wt 47.6 kg (105 lb)   LMP 07/17/2015   SpO2 97%   BMI 18.60 kg/m   Physical Exam  Constitutional: She is oriented to person, place, and time. She appears well-developed and well-nourished. No distress.  HENT:  Head: Normocephalic and atraumatic.  Eyes: Conjunctivae and EOM are normal.  Cardiovascular: Normal rate and regular rhythm.  Pulmonary/Chest: Tachypnea noted. She has decreased breath sounds. She has wheezes.  Abdominal: She exhibits no distension.  Musculoskeletal: She exhibits no edema.  Neurological: She is alert and oriented to person, place, and time. No cranial nerve deficit.  Skin: Skin is warm and dry.  Psychiatric: She has a normal mood and affect.  Nursing note and vitals reviewed.    ED  Treatments / Results  Labs (all labs ordered are listed, but only abnormal results are displayed) Labs Reviewed  CBC WITH DIFFERENTIAL/PLATELET - Abnormal; Notable for the following components:      Result Value   WBC 12.6 (*)    Hemoglobin 16.3 (*)    HCT 49.7 (*)    MCV 101.0 (*)    Neutro Abs 8.9 (*)    Monocytes Absolute 1.1 (*)    All other components within normal limits  BASIC METABOLIC PANEL - Abnormal; Notable for the following components:   Sodium 134 (*)    Potassium 2.9 (*)    Chloride 95 (*)    All other components within normal limits  TROPONIN I    EKG  EKG Interpretation  Date/Time:  Friday March 27 2017 11:34:49 EST Ventricular Rate:  96 PR Interval:  144 QRS Duration: 70 QT Interval:  330 QTC Calculation: 416 R Axis:   -33 Text Interpretation:  Normal sinus rhythm Left axis deviation Left ventricular hypertrophy Abnormal ekg Confirmed by Gerhard Munch [phone]) on 03/27/2017 2:10:06 PM       Radiology Dg Chest 2 View  Result Date: 03/27/2017 CLINICAL DATA:  Shortness of breath, congestion, cough for 3 days EXAM: CHEST  2 VIEW COMPARISON:  09/16/2015 FINDINGS: The heart size and mediastinal contours are within normal limits. Both lungs are clear. The visualized skeletal structures are unremarkable. IMPRESSION: No active cardiopulmonary disease. Electronically Signed   By: Elige Ko   On: 03/27/2017 12:15   Ct Chest Wo Contrast  Result Date: 03/27/2017 CLINICAL DATA:  Productive cough and congestion. Generalized body aches. Shortness of breath. COPD exacerbation, complicated. EXAM: CT CHEST WITHOUT CONTRAST TECHNIQUE: Multidetector CT imaging of the chest was performed following the standard protocol without IV contrast. COMPARISON:  Two-view chest x-ray 03/27/2017. CT of the chest 01/07/2005. FINDINGS: Cardiovascular: Heart size is normal. Atherosclerotic calcifications are present at the aorta. Great vessel origins are within normal limits. Pulmonary  arteries are unremarkable. No significant pericardial effusion is present. Mediastinum/Nodes: No significant mediastinal, hilar, or axillary adenopathy is present. Lungs/Pleura: Is centrilobular emphysematous changes are again noted. Scattered ground-glass attenuation is present in the right middle lobe and lingula. No significant airspace consolidation is present. Minimal atelectasis is present at the lung bases. Upper Abdomen: Limited imaging of the upper abdomen is unremarkable. Musculoskeletal: Vertebral body heights and alignment are normal. No focal lytic or blastic lesions are present. IMPRESSION: 1.  Emphysema (ICD10-J43.9). 2. Minimal ground-glass attenuation in the medial right middle lobe and lingula. This likely reflects atelectasis or edema. Infection is considered less likely. 3. No significant airspace consolidation. Electronically Signed  By: Marin Robertshristopher  Mattern M.D.   On: 03/27/2017 18:07    Procedures Procedures (including critical care time)  Medications Ordered in ED Medications  albuterol (PROVENTIL) (2.5 MG/3ML) 0.083% nebulizer solution 5 mg (5 mg Nebulization Not Given 03/27/17 1649)  albuterol (PROVENTIL,VENTOLIN) solution continuous neb (10 mg/hr Nebulization New Bag/Given 03/27/17 1511)  methylPREDNISolone sodium succinate (SOLU-MEDROL) 125 mg/2 mL injection 125 mg (125 mg Intravenous Given 03/27/17 1423)  ketorolac (TORADOL) 30 MG/ML injection 15 mg (15 mg Intravenous Given 03/27/17 1528)  ipratropium-albuterol (DUONEB) 0.5-2.5 (3) MG/3ML nebulizer solution 3 mL (3 mLs Nebulization Given 03/27/17 1647)  morphine 4 MG/ML injection 4 mg (4 mg Intravenous Given 03/27/17 1658)     Initial Impression / Assessment and Plan / ED Course  I have reviewed the triage vital signs and the nursing notes.  Pertinent labs & imaging results that were available during my care of the patient were reviewed by me and considered in my medical decision making (see chart for details).     Update:  After initial breathing treatment, steroids, the patient remains tachypneic, given her history of COPD, though the initial x-ray was reassuring, there is some suspicion for pneumonia, CT ordered.  Update:, Now after multiple other breathing treatments, the patient appears better.  She has received morphine, Toradol, steroids, albuterol, fluids   6:33 PM Patient much more calm, no appreciable discomfort, she has mild persistent tachycardia, but work of breathing is substantially reduced. We discussed all findings again, including minor leukocytosis, but with no ongoing nausea, vomiting, or fever there is low suspicion for hepatobiliary dysfunction. No CT, x-ray evidence for pneumonia. There is some suspicion for influenza, getting COPD exacerbation, and the patient is aware of need for rest, fluids, albuterol, steroids, return precautions.    Final Clinical Impressions(s) / ED Diagnoses   Final diagnoses:  Influenza-like illness    ED Discharge Orders        Ordered    predniSONE (DELTASONE) 20 MG tablet  Daily with breakfast     03/27/17 1836    chlorpheniramine-phenylephrine (CARDEC) 1-3.5 MG/ML LIQD  4 times daily PRN     03/27/17 1836       Gerhard MunchLockwood, Ismahan Lippman, MD 03/27/17 [phone]

## 2017-03-27 NOTE — ED Notes (Signed)
Patient transported to CT 

## 2017-03-27 NOTE — ED Triage Notes (Addendum)
Pt c/o of productive cough, congestion, generalized body aches  SOB and diarrhea since Tuesday. Denies fever or urinary symptoms.

## 2017-03-27 NOTE — Discharge Instructions (Signed)
As discussed, your evaluation today has been largely reassuring.  But, it is important that you monitor your condition carefully, and do not hesitate to return to the ED if you develop new, or concerning changes in your condition. ? ?Otherwise, please follow-up with your physician for appropriate ongoing care. ? ?

## 2017-08-19 ENCOUNTER — Other Ambulatory Visit: Payer: Self-pay

## 2017-08-19 ENCOUNTER — Emergency Department (HOSPITAL_COMMUNITY)
Admission: EM | Admit: 2017-08-19 | Discharge: 2017-08-19 | Disposition: A | Discharge status: Home / Self Care | Payer: Medicaid Other | Attending: Emergency Medicine | Admitting: Emergency Medicine

## 2017-08-19 ENCOUNTER — Encounter (HOSPITAL_COMMUNITY): Payer: Self-pay | Admitting: Emergency Medicine

## 2017-08-19 ENCOUNTER — Emergency Department (HOSPITAL_COMMUNITY): Payer: Medicaid Other

## 2017-08-19 DIAGNOSIS — J209 Acute bronchitis, unspecified: Secondary | ICD-10-CM

## 2017-08-19 DIAGNOSIS — R51 Headache: Secondary | ICD-10-CM | POA: Insufficient documentation

## 2017-08-19 DIAGNOSIS — I10 Essential (primary) hypertension: Secondary | ICD-10-CM | POA: Insufficient documentation

## 2017-08-19 DIAGNOSIS — F17210 Nicotine dependence, cigarettes, uncomplicated: Secondary | ICD-10-CM | POA: Diagnosis not present

## 2017-08-19 DIAGNOSIS — Z720 Tobacco use: Secondary | ICD-10-CM

## 2017-08-19 DIAGNOSIS — R05 Cough: Secondary | ICD-10-CM | POA: Diagnosis present

## 2017-08-19 DIAGNOSIS — J441 Chronic obstructive pulmonary disease with (acute) exacerbation: Secondary | ICD-10-CM | POA: Insufficient documentation

## 2017-08-19 DIAGNOSIS — R519 Headache, unspecified: Secondary | ICD-10-CM

## 2017-08-19 HISTORY — DX: Essential (primary) hypertension: I10

## 2017-08-19 LAB — BASIC METABOLIC PANEL
Anion gap: 9 (ref 5–15)
BUN: 8 mg/dL (ref 6–20)
CO2: 24 mmol/L (ref 22–32)
CREATININE: 0.87 mg/dL (ref 0.44–1.00)
Calcium: 9.6 mg/dL (ref 8.9–10.3)
Chloride: 105 mmol/L (ref 98–111)
GFR calc Af Amer: >60 mL/min (ref >60)
GFR calc non Af Amer: >60 mL/min (ref >60)
GLUCOSE: 139 mg/dL — AB (ref 70–99)
Potassium: 3.8 mmol/L (ref 3.5–5.1)
SODIUM: 138 mmol/L (ref 135–145)

## 2017-08-19 LAB — CBC WITH DIFFERENTIAL/PLATELET
Basophils Absolute: 0 10*3/uL (ref 0.0–0.1)
Basophils Relative: 0 %
EOS ABS: 0 10*3/uL (ref 0.0–0.7)
EOS PCT: 0 %
HCT: 47.6 % — ABNORMAL HIGH (ref 36.0–46.0)
Hemoglobin: 16.3 g/dL — ABNORMAL HIGH (ref 12.0–15.0)
LYMPHS ABS: 1.2 10*3/uL (ref 0.7–4.0)
LYMPHS PCT: 12 %
MCH: 34.7 pg — AB (ref 26.0–34.0)
MCHC: 34.2 g/dL (ref 30.0–36.0)
MCV: 101.3 fL — AB (ref 78.0–100.0)
MONO ABS: 0.6 10*3/uL (ref 0.1–1.0)
Monocytes Relative: 6 %
Neutro Abs: 8.2 10*3/uL — ABNORMAL HIGH (ref 1.7–7.7)
Neutrophils Relative %: 82 %
PLATELETS: 284 10*3/uL (ref 150–400)
RBC: 4.7 MIL/uL (ref 3.87–5.11)
RDW: 13 % (ref 11.5–15.5)
WBC: 10 10*3/uL (ref 4.0–10.5)

## 2017-08-19 MED ORDER — ONDANSETRON HCL 4 MG/2ML IJ SOLN
4.0000 mg | Freq: Once | INTRAMUSCULAR | Status: AC
  Administered 2017-08-19: 4 mg via INTRAVENOUS
  Filled 2017-08-19: qty 2

## 2017-08-19 MED ORDER — ALBUTEROL SULFATE HFA 108 (90 BASE) MCG/ACT IN AERS
1.0000 | INHALATION_SPRAY | RESPIRATORY_TRACT | Status: DC | PRN
  Administered 2017-08-19: 2 via RESPIRATORY_TRACT
  Filled 2017-08-19: qty 6.7

## 2017-08-19 MED ORDER — MORPHINE SULFATE (PF) 4 MG/ML IV SOLN
4.0000 mg | Freq: Once | INTRAVENOUS | Status: AC
  Administered 2017-08-19: 4 mg via INTRAVENOUS
  Filled 2017-08-19: qty 1

## 2017-08-19 MED ORDER — IPRATROPIUM-ALBUTEROL 0.5-2.5 (3) MG/3ML IN SOLN
3.0000 mL | Freq: Once | RESPIRATORY_TRACT | Status: AC
  Administered 2017-08-19: 3 mL via RESPIRATORY_TRACT
  Filled 2017-08-19: qty 3

## 2017-08-19 MED ORDER — PREDNISONE 10 MG (21) PO TBPK: ORAL_TABLET | Freq: Every day | ORAL | 0 refills | Status: DC

## 2017-08-19 MED ORDER — MAGNESIUM SULFATE 2 GM/50ML IV SOLN
2.0000 g | Freq: Once | INTRAVENOUS | Status: AC
  Administered 2017-08-19: 2 g via INTRAVENOUS
  Filled 2017-08-19: qty 50

## 2017-08-19 MED ORDER — METHYLPREDNISOLONE SODIUM SUCC 125 MG IJ SOLR
125.0000 mg | Freq: Once | INTRAMUSCULAR | Status: AC
  Administered 2017-08-19: 125 mg via INTRAVENOUS
  Filled 2017-08-19: qty 2

## 2017-08-19 MED ORDER — AEROCHAMBER Z-STAT PLUS/MEDIUM MISC
1.0000 | Freq: Once | Status: DC
  Filled 2017-08-19: qty 1

## 2017-08-19 MED ORDER — ALBUTEROL (5 MG/ML) CONTINUOUS INHALATION SOLN
10.0000 mg/h | INHALATION_SOLUTION | RESPIRATORY_TRACT | Status: DC
  Administered 2017-08-19: 10 mg/h via RESPIRATORY_TRACT
  Filled 2017-08-19: qty 20

## 2017-08-19 MED ORDER — AZITHROMYCIN 250 MG PO TABS: ORAL_TABLET | ORAL | 0 refills | Status: DC

## 2017-08-19 MED ORDER — SODIUM CHLORIDE 0.9 % IV BOLUS
1000.0000 mL | Freq: Once | INTRAVENOUS | Status: AC
  Administered 2017-08-19: 1000 mL via INTRAVENOUS

## 2017-08-19 MED ORDER — AZITHROMYCIN 250 MG PO TABS
500.0000 mg | ORAL_TABLET | Freq: Once | ORAL | Status: AC
  Administered 2017-08-19: 500 mg via ORAL
  Filled 2017-08-19: qty 2

## 2017-08-19 MED ORDER — KETOROLAC TROMETHAMINE 30 MG/ML IJ SOLN
30.0000 mg | Freq: Once | INTRAMUSCULAR | Status: AC
  Administered 2017-08-19: 30 mg via INTRAVENOUS
  Filled 2017-08-19: qty 1

## 2017-08-19 NOTE — Discharge Instructions (Signed)
Do not smoke.  Return if worse.

## 2017-08-19 NOTE — ED Provider Notes (Signed)
[facility] EMERGENCY DEPARTMENT Provider Note   CSN: [id number] Arrival date & time: 08/19/17  0702     History   Chief Complaint Chief Complaint  Patient presents with  . Cough    HPI Lauren Mathis is a 47 y.o. female.  Pt presents to the ED today with cough and body aches.  Cough has been going on for 2 days.  The pt developed a headache which started today.  She does smoke and has used her mom's nebulizer machine to help with cough.  She has not smoked any today.  No f/c.     Past Medical History:  Diagnosis Date  . COPD (chronic obstructive pulmonary disease) (HCC)   . Hypertension   . Migraine headache     There are no active problems to display for this patient.   Past Surgical History:  Procedure Laterality Date  . TUBAL LIGATION       OB History    Gravida  3   Para  3   Term  3   Preterm      AB      Living        SAB      TAB      Ectopic      Multiple      Live Births               Home Medications    Prior to Admission medications   Medication Sig Start Date End Date Taking? Authorizing Provider  albuterol (PROVENTIL HFA;VENTOLIN HFA) 108 (90 Base) MCG/ACT inhaler Inhale 1-2 puffs into the lungs every 6 (six) hours as needed for wheezing or shortness of breath.    [provider]  azithromycin (ZITHROMAX) 250 MG tablet Take 1 every day until finished. 08/19/17   Jacalyn LefevreHaviland, Earnestine Shipp, MD  chlorpheniramine-phenylephrine (CARDEC) 1-3.5 MG/ML LIQD Take 0.75 mLs by mouth 4 (four) times daily as needed. 03/27/17   Gerhard MunchLockwood, Robert, MD  ibuprofen (ADVIL,MOTRIN) 200 MG tablet Take 1,000-1,200 mg by mouth every 6 (six) hours as needed for moderate pain.     [provider]  predniSONE (STERAPRED UNI-PAK 21 TAB) 10 MG (21) TBPK tablet Take by mouth daily. Take 6 tabs by mouth daily  for 2 days, then 5 tabs for 2 days, then 4 tabs for 2 days, then 3 tabs for 2 days, 2 tabs for 2 days, then 1 tab by mouth daily for 2 days 08/19/17    Jacalyn LefevreHaviland, Emri Sample, MD    Family History Family History  Problem Relation Age of Onset  . Asthma Mother   . Asthma Father     Social History Social History   Tobacco Use  . Smoking status: Current Every Day Smoker    Packs/day: 2.00    Types: Cigarettes  . Smokeless tobacco: Never Used  Substance Use Topics  . Alcohol use: Yes    Alcohol/week: 3.6 oz    Types: 6 Cans of beer per week    Comment: 6-7 beers daily  . Drug use: No     Allergies   Patient has no known allergies.   Review of Systems Review of Systems  Respiratory: Positive for cough, shortness of breath and wheezing.   All other systems reviewed and are negative.    Physical Exam Updated Vital Signs BP 114/77   Pulse 95   Temp 98 F (36.7 C) (Oral)   Resp 20   Ht 5\' 3"  (1.6 m)   Wt 47.6 kg (  105 lb)   LMP 07/17/2015   SpO2 92%   BMI 18.60 kg/m   Physical Exam  Constitutional: She is oriented to person, place, and time. She appears well-developed and well-nourished.  HENT:  Head: Normocephalic and atraumatic.  Right Ear: External ear normal.  Left Ear: External ear normal.  Nose: Nose normal.  Mouth/Throat: Oropharynx is clear and moist.  Eyes: Pupils are equal, round, and reactive to light. Conjunctivae and EOM are normal.  Neck: Normal range of motion. Neck supple.  Cardiovascular: Normal rate, regular rhythm, normal heart sounds and intact distal pulses.  Pulmonary/Chest: She has wheezes.  Abdominal: Soft. Bowel sounds are normal.  Musculoskeletal: Normal range of motion.  Neurological: She is alert and oriented to person, place, and time.  Skin: Skin is warm. Capillary refill takes less than 2 seconds.  Psychiatric: She has a normal mood and affect. Her behavior is normal. Judgment and thought content normal.  Nursing note and vitals reviewed.    ED Treatments / Results  Labs (all labs ordered are listed, but only abnormal results are displayed) Labs Reviewed  BASIC METABOLIC  PANEL - Abnormal; Notable for the following components:      Result Value   Glucose, Bld 139 (*)    All other components within normal limits  CBC WITH DIFFERENTIAL/PLATELET - Abnormal; Notable for the following components:   Hemoglobin 16.3 (*)    HCT 47.6 (*)    MCV 101.3 (*)    MCH 34.7 (*)    Neutro Abs 8.2 (*)    All other components within normal limits    EKG None  Radiology Dg Chest 2 View  Result Date: 08/19/2017 CLINICAL DATA:  Productive cough, wheezing. EXAM: CHEST - 2 VIEW COMPARISON:  Radiographs of March 27, 2017. FINDINGS: The heart size and mediastinal contours are within normal limits. Both lungs are clear. No pneumothorax or pleural effusion is noted. The visualized skeletal structures are unremarkable. IMPRESSION: No active cardiopulmonary disease. Electronically Signed   By: Lupita Raider, M.D.   On: 08/19/2017 08:00    Procedures Procedures (including critical care time)  Medications Ordered in ED Medications  albuterol (PROVENTIL,VENTOLIN) solution continuous neb (10 mg/hr Nebulization New Bag/Given 08/19/17 0745)  albuterol (PROVENTIL HFA;VENTOLIN HFA) 108 (90 Base) MCG/ACT inhaler 1-2 puff (has no administration in time range)  AEROCHAMBER PLUS FLO-VU MEDIUM MISC 1 each (has no administration in time range)  azithromycin (ZITHROMAX) tablet 500 mg (has no administration in time range)  sodium chloride 0.9 % bolus 1,000 mL (0 mLs Intravenous Stopped 08/19/17 0935)  ketorolac (TORADOL) 30 MG/ML injection 30 mg (30 mg Intravenous Given 08/19/17 0834)  ondansetron (ZOFRAN) injection 4 mg (4 mg Intravenous Given 08/19/17 0834)  methylPREDNISolone sodium succinate (SOLU-MEDROL) 125 mg/2 mL injection 125 mg (125 mg Intravenous Given 08/19/17 0834)  morphine 4 MG/ML injection 4 mg (4 mg Intravenous Given 08/19/17 0908)  ipratropium-albuterol (DUONEB) 0.5-2.5 (3) MG/3ML nebulizer solution 3 mL (3 mLs Nebulization Given 08/19/17 0955)  magnesium sulfate IVPB 2 g 50 mL (0 g  Intravenous Stopped 08/19/17 1100)     Initial Impression / Assessment and Plan / ED Course  I have reviewed the triage vital signs and the nursing notes.  Pertinent labs & imaging results that were available during my care of the patient were reviewed by me and considered in my medical decision making (see chart for details).    Pt is feeling better after nebs.  Pt's nurse ambulated pt and documented O2 sats in  the upper 80s.  However, the nurse told me that was not a good wave form.  Therefore, those readings are not accurate.  Pt is instructed to stop smoking.  Return if her breathing worsens.  She is given an inhaler/spacer prior to d/c.  Final Clinical Impressions(s) / ED Diagnoses   Final diagnoses:  COPD with acute exacerbation (HCC)  Acute bronchitis, unspecified organism  Tobacco abuse  Acute nonintractable headache, unspecified headache type    ED Discharge Orders        Ordered    azithromycin (ZITHROMAX) 250 MG tablet     08/19/17 1130    predniSONE (STERAPRED UNI-PAK 21 TAB) 10 MG (21) TBPK tablet  Daily     08/19/17 1130       Jacalyn Lefevre, MD 08/19/17 1131

## 2017-08-19 NOTE — ED Triage Notes (Signed)
PT c/o productive sputum cough and wheezing with body aches x2 days with no fevers. PT also c/o headache that started today.

## 2017-08-19 NOTE — ED Notes (Signed)
Pt O2 sat 88%-92% on room air with ambulation. Pt tolerated well, slight shortness of breath noted at times. Pt assisted back to bed and O2 sat 92% at rest on room air.

## 2017-08-26 ENCOUNTER — Emergency Department (HOSPITAL_COMMUNITY)
Admission: EM | Admit: 2017-08-26 | Discharge: 2017-08-26 | Disposition: A | Discharge status: Home / Self Care | Payer: Medicaid Other | Attending: Emergency Medicine | Admitting: Emergency Medicine

## 2017-08-26 ENCOUNTER — Encounter (HOSPITAL_COMMUNITY): Payer: Self-pay | Admitting: Emergency Medicine

## 2017-08-26 ENCOUNTER — Emergency Department (HOSPITAL_COMMUNITY): Payer: Medicaid Other

## 2017-08-26 ENCOUNTER — Other Ambulatory Visit: Payer: Self-pay

## 2017-08-26 DIAGNOSIS — K6289 Other specified diseases of anus and rectum: Secondary | ICD-10-CM | POA: Diagnosis not present

## 2017-08-26 DIAGNOSIS — F17210 Nicotine dependence, cigarettes, uncomplicated: Secondary | ICD-10-CM | POA: Insufficient documentation

## 2017-08-26 DIAGNOSIS — K625 Hemorrhage of anus and rectum: Secondary | ICD-10-CM | POA: Insufficient documentation

## 2017-08-26 DIAGNOSIS — I10 Essential (primary) hypertension: Secondary | ICD-10-CM | POA: Diagnosis not present

## 2017-08-26 DIAGNOSIS — J449 Chronic obstructive pulmonary disease, unspecified: Secondary | ICD-10-CM | POA: Insufficient documentation

## 2017-08-26 DIAGNOSIS — R0602 Shortness of breath: Secondary | ICD-10-CM | POA: Insufficient documentation

## 2017-08-26 DIAGNOSIS — R21 Rash and other nonspecific skin eruption: Secondary | ICD-10-CM | POA: Diagnosis not present

## 2017-08-26 LAB — TYPE AND SCREEN
ABO/RH(D): A POS
ANTIBODY SCREEN: NEGATIVE

## 2017-08-26 LAB — COMPREHENSIVE METABOLIC PANEL
ALBUMIN: 4.2 g/dL (ref 3.5–5.0)
ALT: 15 U/L (ref 0–44)
AST: 20 U/L (ref 15–41)
Alkaline Phosphatase: 120 U/L (ref 38–126)
Anion gap: 12 (ref 5–15)
BUN: 19 mg/dL (ref 6–20)
CHLORIDE: 99 mmol/L (ref 98–111)
CO2: 24 mmol/L (ref 22–32)
CREATININE: 1.02 mg/dL — AB (ref 0.44–1.00)
Calcium: 9.6 mg/dL (ref 8.9–10.3)
GFR calc Af Amer: >60 mL/min (ref >60)
GFR calc non Af Amer: >60 mL/min (ref >60)
Glucose, Bld: 107 mg/dL — ABNORMAL HIGH (ref 70–99)
POTASSIUM: 3.7 mmol/L (ref 3.5–5.1)
SODIUM: 135 mmol/L (ref 135–145)
Total Bilirubin: 0.4 mg/dL (ref 0.3–1.2)
Total Protein: 8.2 g/dL — ABNORMAL HIGH (ref 6.5–8.1)

## 2017-08-26 LAB — CBC
HCT: 43.6 % (ref 36.0–46.0)
Hemoglobin: 14.9 g/dL (ref 12.0–15.0)
MCH: 34.2 pg — ABNORMAL HIGH (ref 26.0–34.0)
MCHC: 34.2 g/dL (ref 30.0–36.0)
MCV: 100 fL (ref 78.0–100.0)
PLATELETS: 459 10*3/uL — AB (ref 150–400)
RBC: 4.36 MIL/uL (ref 3.87–5.11)
RDW: 13 % (ref 11.5–15.5)
WBC: 13.7 10*3/uL — AB (ref 4.0–10.5)

## 2017-08-26 LAB — POC OCCULT BLOOD, ED: FECAL OCCULT BLD: NEGATIVE

## 2017-08-26 MED ORDER — PREDNISONE 20 MG PO TABS: 40.0000 mg | ORAL_TABLET | Freq: Every day | ORAL | 0 refills | Status: DC

## 2017-08-26 MED ORDER — NYSTATIN 100000 UNIT/GM EX CREA: TOPICAL_CREAM | CUTANEOUS | 0 refills | Status: DC

## 2017-08-26 NOTE — ED Triage Notes (Signed)
Onset last night dark red stools, abdominal pain. Pt is being treated for COPD and bronchitis excerebration. Coughing in triage.

## 2017-08-26 NOTE — ED Notes (Signed)
Rounding in waiting room done. Delay explained to patient. Warm blanket given.

## 2017-08-26 NOTE — ED Notes (Signed)
Pt reports she has not had BP meds in approx 6 months.

## 2017-08-26 NOTE — Discharge Instructions (Signed)
Please call the phone number of the gastroenterologist listed above to schedule a follow-up in regards to the bleeding.  However if you have increasing amounts of bleeding, abdominal pain, vomiting you should return to the emergency department immediately.  Please apply the cream that I have prescribed topically to the skin twice a day to help with the rash around her bottom.  You may take prednisone daily to help with the breathing and make sure that you take albuterol every 4 hours as needed for wheezing and coughing.  Please stop smoking as this will help your lungs to heal

## 2017-08-26 NOTE — ED Notes (Signed)
Patient transported to X-ray 

## 2017-08-26 NOTE — ED Provider Notes (Signed)
[facility] EMERGENCY DEPARTMENT Provider Note   CSN: [id number] Arrival date & time: 08/26/17  1317     History   Chief Complaint Chief Complaint  Patient presents with  . Rectal Bleeding    HPI Lauren Mathis is a 47 y.o. female.  HPI  The patient is a 47 year old female with a known history of COPD as well as a history of high blood pressure.  She has never had any problems with GI bleeding.  Review of the medical record shows that she was seen on 4 July during which time she was having a COPD exacerbation, increasing wheezing coughing and shortness of breath.  She was discharged with prednisone and Zithromax but states that she never really improved.  She is back because of ongoing shortness of breath which is unchanged.  She also states that she had a bowel movement with some dark red blood on it last night.  She only noticed this when she wiped.  Again this morning when she wiped she noticed that she had some small amount of blood on the toilet paper.  She denies any vaginal bleeding, she has not had a menstrual cycle in a couple of years.  No hx of coagulopathy.  Has pain with BM last night - still having pain in the rectal area.  Is inserting anything into her anus or rectum.  She has never had hemorrhoids fissures or rectal bleeding.  She has noted a small amount of redness around the anus.  Past Medical History:  Diagnosis Date  . COPD (chronic obstructive pulmonary disease) (HCC)   . Hypertension   . Migraine headache     There are no active problems to display for this patient.   Past Surgical History:  Procedure Laterality Date  . TUBAL LIGATION       OB History    Gravida  3   Para  3   Term  3   Preterm      AB      Living        SAB      TAB      Ectopic      Multiple      Live Births               Home Medications    Prior to Admission medications   Medication Sig Start Date End Date Taking? Authorizing Provider  ibuprofen  (ADVIL,MOTRIN) 200 MG tablet Take 1,000-1,200 mg by mouth every 6 (six) hours as needed for moderate pain.    Yes [provider]  albuterol (PROVENTIL HFA;VENTOLIN HFA) 108 (90 Base) MCG/ACT inhaler Inhale 1-2 puffs into the lungs every 6 (six) hours as needed for wheezing or shortness of breath.    [provider]  nystatin cream (MYCOSTATIN) Apply to affected area 2 times daily 08/26/17   Eber HongMiller, Necola Bluestein, MD  predniSONE (DELTASONE) 20 MG tablet Take 2 tablets (40 mg total) by mouth daily. 08/26/17   Eber HongMiller, Delon Revelo, MD    Family History Family History  Problem Relation Age of Onset  . Asthma Mother   . Asthma Father     Social History Social History   Tobacco Use  . Smoking status: Current Every Day Smoker    Packs/day: 2.00    Types: Cigarettes  . Smokeless tobacco: Never Used  Substance Use Topics  . Alcohol use: Yes    Alcohol/week: 3.6 oz    Types: 6 Cans of beer per week  Comment: 6-7 beers daily  . Drug use: No     Allergies   Patient has no known allergies.   Review of Systems Review of Systems  All other systems reviewed and are negative.    Physical Exam Updated Vital Signs BP (!) 164/81   Pulse 62   Temp 97.9 F (36.6 C) (Oral)   Resp 11   Ht 5\' 3"  (1.6 m)   Wt 47.6 kg (105 lb)   LMP 07/17/2015   SpO2 98%   BMI 18.60 kg/m   Physical Exam  Constitutional: She appears well-developed and well-nourished. No distress.  HENT:  Head: Normocephalic and atraumatic.  Mouth/Throat: Oropharynx is clear and moist. No oropharyngeal exudate.  Eyes: Pupils are equal, round, and reactive to light. Conjunctivae and EOM are normal. Right eye exhibits no discharge. Left eye exhibits no discharge. No scleral icterus.  Neck: Normal range of motion. Neck supple. No JVD present. No thyromegaly present.  Cardiovascular: Normal rate, regular rhythm, normal heart sounds and intact distal pulses. Exam reveals no gallop and no friction rub.  No murmur  heard. Pulmonary/Chest: Effort normal and breath sounds normal. No respiratory distress. She has no wheezes. She has no rales.  Abdominal: Soft. Bowel sounds are normal. She exhibits no distension and no mass. There is no tenderness.  Genitourinary:  Genitourinary Comments: With chaperone present in the patient in the right lateral decubitus position a rectal exam was performed.  She has a small amount of perianal redness, there is no active fissures hemorrhoids or bleeding.  There is no masses palpated inside the rectal vault, there is no stool in the rectal vault.  No visible bleeding from the tissues of the vagina the vaginal introitus  Musculoskeletal: Normal range of motion. She exhibits no edema or tenderness.  Lymphadenopathy:    She has no cervical adenopathy.  Neurological: She is alert. Coordination normal.  Skin: Skin is warm and dry. No rash noted. No erythema.  Psychiatric: She has a normal mood and affect. Her behavior is normal.  Nursing note and vitals reviewed.    ED Treatments / Results  Labs (all labs ordered are listed, but only abnormal results are displayed) Labs Reviewed  COMPREHENSIVE METABOLIC PANEL - Abnormal; Notable for the following components:      Result Value   Glucose, Bld 107 (*)    Creatinine, Ser 1.02 (*)    Total Protein 8.2 (*)    All other components within normal limits  CBC - Abnormal; Notable for the following components:   WBC 13.7 (*)    MCH 34.2 (*)    Platelets 459 (*)    All other components within normal limits  PREGNANCY, URINE  POC OCCULT BLOOD, ED  TYPE AND SCREEN    EKG EKG Interpretation  Date/Time:  Wednesday August 26 2017 17:24:14 EDT Ventricular Rate:  50 PR Interval:    QRS Duration: 84 QT Interval:  417 QTC Calculation: 381 R Axis:   90 Text Interpretation:  Sinus rhythm Atrial premature complex Borderline right axis deviation Since last tracing rate slower Confirmed by Eber Hong ([id number]) on 08/26/2017 6:12:49  PM   Radiology Dg Chest 2 View  Result Date: 08/26/2017 CLINICAL DATA:  Initial evaluation for acute cough, shortness of breath. EXAM: CHEST - 2 VIEW COMPARISON:  Prior radiograph from 08/19/2017. FINDINGS: The cardiac and mediastinal silhouettes are stable in size and contour, and remain within normal limits. Aortic atherosclerosis. The lungs are normally inflated. No airspace consolidation, pleural effusion, or  pulmonary edema is identified. There is no pneumothorax. No acute osseous abnormality identified. IMPRESSION: 1. No active cardiopulmonary disease. 2. Aortic atherosclerosis. Electronically Signed   By: Rise Mu M.D.   On: 08/26/2017 19:24    Procedures Procedures (including critical care time)  Medications Ordered in ED Medications - No data to display   Initial Impression / Assessment and Plan / ED Course  I have reviewed the triage vital signs and the nursing notes.  Pertinent labs & imaging results that were available during my care of the patient were reviewed by me and considered in my medical decision making (see chart for details).     The patient refuses a pelvic exam to look for vaginal bleeding, Hemoccult negative, labs unremarkable except for a mild leukocytosis but given recent prednisone use this is not concerning.  She is still wheezing, still short of breath, x-rays negative, will place on couple more days of prednisone encourage ongoing albuterol use and the patient will be referred to local gastroenterology.  She will also be given a nystatin cream for the perianal rash.  She is in agreement with the plan.  Stable for discharge  Final Clinical Impressions(s) / ED Diagnoses   Final diagnoses:  Rectal bleeding  Rash in adult    ED Discharge Orders        Ordered    nystatin cream (MYCOSTATIN)     08/26/17 2030    predniSONE (DELTASONE) 20 MG tablet  Daily     08/26/17 2030       Eber Hong, MD 08/26/17 2031

## 2017-08-26 NOTE — ED Notes (Signed)
Patients checked on in waiting area. Delay explained. 

## 2018-04-14 ENCOUNTER — Emergency Department (HOSPITAL_COMMUNITY): Payer: Self-pay

## 2018-04-14 ENCOUNTER — Emergency Department (HOSPITAL_COMMUNITY)
Admission: EM | Admit: 2018-04-14 | Discharge: 2018-04-14 | Disposition: A | Discharge status: Home / Self Care | Payer: Self-pay | Attending: Emergency Medicine | Admitting: Emergency Medicine

## 2018-04-14 ENCOUNTER — Encounter (HOSPITAL_COMMUNITY): Payer: Self-pay | Admitting: Emergency Medicine

## 2018-04-14 ENCOUNTER — Other Ambulatory Visit: Payer: Self-pay

## 2018-04-14 DIAGNOSIS — F17210 Nicotine dependence, cigarettes, uncomplicated: Secondary | ICD-10-CM | POA: Insufficient documentation

## 2018-04-14 DIAGNOSIS — J101 Influenza due to other identified influenza virus with other respiratory manifestations: Secondary | ICD-10-CM | POA: Insufficient documentation

## 2018-04-14 DIAGNOSIS — J111 Influenza due to unidentified influenza virus with other respiratory manifestations: Secondary | ICD-10-CM

## 2018-04-14 DIAGNOSIS — J449 Chronic obstructive pulmonary disease, unspecified: Secondary | ICD-10-CM | POA: Insufficient documentation

## 2018-04-14 DIAGNOSIS — Z79899 Other long term (current) drug therapy: Secondary | ICD-10-CM | POA: Insufficient documentation

## 2018-04-14 DIAGNOSIS — I10 Essential (primary) hypertension: Secondary | ICD-10-CM | POA: Insufficient documentation

## 2018-04-14 LAB — INFLUENZA PANEL BY PCR (TYPE A & B)
INFLAPCR: POSITIVE — AB
Influenza B By PCR: NEGATIVE

## 2018-04-14 LAB — CBG MONITORING, ED: Glucose-Capillary: 115 mg/dL — ABNORMAL HIGH (ref 70–99)

## 2018-04-14 MED ORDER — OSELTAMIVIR PHOSPHATE 75 MG PO CAPS: 75.0000 mg | ORAL_CAPSULE | Freq: Two times a day (BID) | ORAL | 0 refills | Status: DC

## 2018-04-14 MED ORDER — ONDANSETRON 4 MG PO TBDP
4.0000 mg | ORAL_TABLET | Freq: Once | ORAL | Status: AC
  Administered 2018-04-14: 4 mg via ORAL
  Filled 2018-04-14: qty 1

## 2018-04-14 MED ORDER — IBUPROFEN 400 MG PO TABS
400.0000 mg | ORAL_TABLET | Freq: Once | ORAL | Status: AC
  Administered 2018-04-14: 400 mg via ORAL
  Filled 2018-04-14: qty 1

## 2018-04-14 MED ORDER — OSELTAMIVIR PHOSPHATE 75 MG PO CAPS
75.0000 mg | ORAL_CAPSULE | Freq: Once | ORAL | Status: AC
  Administered 2018-04-14: 75 mg via ORAL
  Filled 2018-04-14: qty 1

## 2018-04-14 MED ORDER — ONDANSETRON 4 MG PO TBDP: 4.0000 mg | ORAL_TABLET | Freq: Three times a day (TID) | ORAL | 0 refills | Status: AC | PRN

## 2018-04-14 MED ORDER — ACETAMINOPHEN 325 MG PO TABS
650.0000 mg | ORAL_TABLET | Freq: Once | ORAL | Status: AC
  Administered 2018-04-14: 650 mg via ORAL
  Filled 2018-04-14: qty 2

## 2018-04-14 NOTE — ED Provider Notes (Signed)
[facility] EMERGENCY DEPARTMENT Provider Note   CSN: [id number] Arrival date & time: 04/14/18  0509    History   Chief Complaint Chief Complaint  Patient presents with  . Influenza    HPI Lauren Mathis is a 48 y.o. female.     Patient reports fatigue, myalgias, cough, vomiting, diarrhea, chills for the past 3 days.  She comes in today because she is feeling worse and has no appetite.  She reports she had multiple sick contacts at home with the flu.  There is no been out of the country travel.  She has not checked her temperature but does not believe that she had a fever.  She been taking TheraFlu at home without relief.  Reports no appetite for the past several days but has been drinking fluids.  Her last episode of vomiting was yesterday.  Has had several episodes of spitting up and several episodes of diarrhea.  Complains of pain and achy all over with headache, sore throat, cough, congestion.  History of COPD, hypertension.  Denies any chest pain or abdominal pain.  The history is provided by the patient.  Influenza  Presenting symptoms: cough, diarrhea, fatigue, headache, myalgias, nausea, rhinorrhea, sore throat and vomiting   Presenting symptoms: no fever   Associated symptoms: chills and nasal congestion     Past Medical History:  Diagnosis Date  . COPD (chronic obstructive pulmonary disease) (HCC)   . Hypertension   . Migraine headache     There are no active problems to display for this patient.   Past Surgical History:  Procedure Laterality Date  . TUBAL LIGATION       OB History    Gravida  3   Para  3   Term  3   Preterm      AB      Living        SAB      TAB      Ectopic      Multiple      Live Births               Home Medications    Prior to Admission medications   Medication Sig Start Date End Date Taking? Authorizing Provider  albuterol (PROVENTIL HFA;VENTOLIN HFA) 108 (90 Base) MCG/ACT inhaler Inhale 1-2 puffs into the  lungs every 6 (six) hours as needed for wheezing or shortness of breath.    [provider]  ibuprofen (ADVIL,MOTRIN) 200 MG tablet Take 1,000-1,200 mg by mouth every 6 (six) hours as needed for moderate pain.     [provider]  nystatin cream (MYCOSTATIN) Apply to affected area 2 times daily 08/26/17   Eber Hong, MD  predniSONE (DELTASONE) 20 MG tablet Take 2 tablets (40 mg total) by mouth daily. 08/26/17   Eber Hong, MD    Family History Family History  Problem Relation Age of Onset  . Asthma Mother   . Asthma Father     Social History Social History   Tobacco Use  . Smoking status: Current Every Day Smoker    Packs/day: 2.00    Types: Cigarettes  . Smokeless tobacco: Never Used  Substance Use Topics  . Alcohol use: Yes    Alcohol/week: 6.0 standard drinks    Types: 6 Cans of beer per week    Comment: 6-7 beers daily  . Drug use: No     Allergies   Patient has no known allergies.   Review of Systems Review of  Systems  Constitutional: Positive for activity change, appetite change, chills and fatigue. Negative for fever.  HENT: Positive for congestion, rhinorrhea and sore throat.   Respiratory: Positive for cough.   Gastrointestinal: Positive for diarrhea, nausea and vomiting.  Genitourinary: Negative for dysuria and hematuria.  Musculoskeletal: Positive for arthralgias and myalgias.  Neurological: Positive for weakness and headaches.   all other systems are negative except as noted in the HPI and PMH.     Physical Exam Updated Vital Signs BP (!) 124/101 (BP Location: Right Arm)   Pulse (!) 107   Temp 98.3 F (36.8 C) (Oral)   Resp 18   Ht 5\' 3"  (1.6 m)   Wt 52.2 kg   LMP 07/17/2015   SpO2 92%   BMI 20.37 kg/m   Physical Exam Vitals signs and nursing note reviewed.  Constitutional:      General: She is not in acute distress.    Appearance: She is well-developed. She is ill-appearing. She is not toxic-appearing.  HENT:      Head: Normocephalic and atraumatic.     Right Ear: Tympanic membrane normal.     Left Ear: Tympanic membrane normal.     Nose: Congestion and rhinorrhea present.     Mouth/Throat:     Mouth: Mucous membranes are moist.     Pharynx: No oropharyngeal exudate.  Eyes:     Conjunctiva/sclera: Conjunctivae normal.     Pupils: Pupils are equal, round, and reactive to light.  Neck:     Musculoskeletal: Normal range of motion and neck supple.     Comments: No meningismus. Cardiovascular:     Rate and Rhythm: Normal rate and regular rhythm.     Heart sounds: Normal heart sounds. No murmur.  Pulmonary:     Effort: Pulmonary effort is normal. No respiratory distress.     Breath sounds: Normal breath sounds. No wheezing.  Abdominal:     Palpations: Abdomen is soft.     Tenderness: There is no abdominal tenderness. There is no guarding or rebound.  Musculoskeletal: Normal range of motion.        General: No tenderness.  Skin:    General: Skin is warm.     Capillary Refill: Capillary refill takes less than 2 seconds.  Neurological:     General: No focal deficit present.     Mental Status: She is alert and oriented to person, place, and time. Mental status is at baseline.     Cranial Nerves: No cranial nerve deficit.     Motor: No abnormal muscle tone.     Coordination: Coordination normal.     Comments: No ataxia on finger to nose bilaterally. No pronator drift. 5/5 strength throughout. CN 2-12 intact.Equal grip strength. Sensation intact.   Psychiatric:        Behavior: Behavior normal.      ED Treatments / Results  Labs (all labs ordered are listed, but only abnormal results are displayed) Labs Reviewed  INFLUENZA PANEL BY PCR (TYPE A & B) - Abnormal; Notable for the following components:      Result Value   Influenza A By PCR POSITIVE (*)    All other components within normal limits  CBG MONITORING, ED - Abnormal; Notable for the following components:   Glucose-Capillary 115 (*)     All other components within normal limits    EKG None  Radiology Dg Chest 2 View  Result Date: 04/14/2018 CLINICAL DATA:  Flu-like symptoms for a week, cough and body aches.  EXAM: CHEST - 2 VIEW COMPARISON:  Chest radiograph August 26, 2017 FINDINGS: Cardiomediastinal silhouette is normal. No pleural effusions or focal consolidations. Minimal RIGHT lung base scarring. Trachea projects midline and there is no pneumothorax. Soft tissue planes and included osseous structures are non-suspicious. IMPRESSION: No acute cardiopulmonary process. Electronically Signed   By: Awilda Metro M.D.   On: 04/14/2018 06:05    Procedures Procedures (including critical care time)  Medications Ordered in ED Medications  acetaminophen (TYLENOL) tablet 650 mg (has no administration in time range)  ondansetron (ZOFRAN-ODT) disintegrating tablet 4 mg (has no administration in time range)     Initial Impression / Assessment and Plan / ED Course  I have reviewed the triage vital signs and the nursing notes.  Pertinent labs & imaging results that were available during my care of the patient were reviewed by me and considered in my medical decision making (see chart for details).       3 days of influenza-like illness with body aches, cough, nausea, vomiting, chills, anorexia, headache, sore throat and diarrhea.  Patient tolerating p.o. in the ED without vomiting or diarrhea.  She was treated empirically for it with Tamiflu for influenza-like illness.  Her chest x-ray is negative.  There is no hypoxia or wheezing.  Discussed p.o. hydration at home, antipyretics and supportive care.  Patient agreeable to Tamiflu after risks and benefits discussed.  She will follow-up with her primary doctor.  During the ED with worsening symptoms including difficulty breathing, persistent vomiting, not able to eat or drink or any other concerns.   Final Clinical Impressions(s) / ED Diagnoses   Final diagnoses:    Influenza    ED Discharge Orders    None       Mikalyn Hermida, Jeannett Senior, MD 04/14/18 [phone]

## 2018-04-14 NOTE — ED Notes (Signed)
Patient given oral fluids.

## 2018-04-14 NOTE — Discharge Instructions (Signed)
Keep yourself hydrated.  Use Tylenol or ibuprofen as needed for aches and fevers.  Follow-up with your doctor.  Return to the ED if you not eating, not drinking, having increased shortness of breath or other concerns.

## 2018-04-14 NOTE — ED Notes (Signed)
Patient transported to X-ray 

## 2018-04-14 NOTE — ED Triage Notes (Signed)
Patient complains of flu like symptoms that have been ongoing x 1 week. Patient has cough, body aches, and no appetite.

## 2018-04-18 ENCOUNTER — Other Ambulatory Visit: Payer: Self-pay

## 2018-04-18 ENCOUNTER — Inpatient Hospital Stay (HOSPITAL_COMMUNITY)
Admission: EM | Admit: 2018-04-18 | Discharge: 2018-04-20 | DRG: 682 | Disposition: A | Discharge status: Home / Self Care | Payer: Medicaid Other | Attending: Family Medicine | Admitting: Family Medicine

## 2018-04-18 ENCOUNTER — Emergency Department (HOSPITAL_COMMUNITY): Payer: Medicaid Other

## 2018-04-18 ENCOUNTER — Encounter (HOSPITAL_COMMUNITY): Payer: Self-pay | Admitting: Emergency Medicine

## 2018-04-18 DIAGNOSIS — Z79899 Other long term (current) drug therapy: Secondary | ICD-10-CM | POA: Diagnosis not present

## 2018-04-18 DIAGNOSIS — Z681 Body mass index (BMI) 19 or less, adult: Secondary | ICD-10-CM

## 2018-04-18 DIAGNOSIS — Z8679 Personal history of other diseases of the circulatory system: Secondary | ICD-10-CM | POA: Diagnosis not present

## 2018-04-18 DIAGNOSIS — E876 Hypokalemia: Secondary | ICD-10-CM | POA: Diagnosis present

## 2018-04-18 DIAGNOSIS — N179 Acute kidney failure, unspecified: Secondary | ICD-10-CM | POA: Diagnosis not present

## 2018-04-18 DIAGNOSIS — J1000 Influenza due to other identified influenza virus with unspecified type of pneumonia: Secondary | ICD-10-CM | POA: Diagnosis present

## 2018-04-18 DIAGNOSIS — Z825 Family history of asthma and other chronic lower respiratory diseases: Secondary | ICD-10-CM | POA: Diagnosis not present

## 2018-04-18 DIAGNOSIS — R531 Weakness: Secondary | ICD-10-CM

## 2018-04-18 DIAGNOSIS — Z7952 Long term (current) use of systemic steroids: Secondary | ICD-10-CM

## 2018-04-18 DIAGNOSIS — Z23 Encounter for immunization: Secondary | ICD-10-CM | POA: Diagnosis not present

## 2018-04-18 DIAGNOSIS — J101 Influenza due to other identified influenza virus with other respiratory manifestations: Secondary | ICD-10-CM | POA: Diagnosis present

## 2018-04-18 DIAGNOSIS — I10 Essential (primary) hypertension: Secondary | ICD-10-CM | POA: Diagnosis present

## 2018-04-18 DIAGNOSIS — F17210 Nicotine dependence, cigarettes, uncomplicated: Secondary | ICD-10-CM | POA: Diagnosis present

## 2018-04-18 DIAGNOSIS — E860 Dehydration: Secondary | ICD-10-CM | POA: Diagnosis present

## 2018-04-18 DIAGNOSIS — K219 Gastro-esophageal reflux disease without esophagitis: Secondary | ICD-10-CM | POA: Diagnosis present

## 2018-04-18 DIAGNOSIS — J449 Chronic obstructive pulmonary disease, unspecified: Secondary | ICD-10-CM | POA: Diagnosis present

## 2018-04-18 DIAGNOSIS — J189 Pneumonia, unspecified organism: Secondary | ICD-10-CM

## 2018-04-18 DIAGNOSIS — R64 Cachexia: Secondary | ICD-10-CM | POA: Diagnosis present

## 2018-04-18 DIAGNOSIS — R1110 Vomiting, unspecified: Secondary | ICD-10-CM

## 2018-04-18 DIAGNOSIS — F17200 Nicotine dependence, unspecified, uncomplicated: Secondary | ICD-10-CM | POA: Diagnosis present

## 2018-04-18 DIAGNOSIS — R112 Nausea with vomiting, unspecified: Secondary | ICD-10-CM | POA: Diagnosis present

## 2018-04-18 DIAGNOSIS — R197 Diarrhea, unspecified: Secondary | ICD-10-CM

## 2018-04-18 LAB — COMPREHENSIVE METABOLIC PANEL
ALT: 34 U/L (ref 0–44)
AST: 53 U/L — ABNORMAL HIGH (ref 15–41)
Albumin: 4 g/dL (ref 3.5–5.0)
Alkaline Phosphatase: 83 U/L (ref 38–126)
Anion gap: 15 (ref 5–15)
BUN: 33 mg/dL — ABNORMAL HIGH (ref 6–20)
CHLORIDE: 96 mmol/L — AB (ref 98–111)
CO2: 22 mmol/L (ref 22–32)
Calcium: 9.1 mg/dL (ref 8.9–10.3)
Creatinine, Ser: 1.36 mg/dL — ABNORMAL HIGH (ref 0.44–1.00)
GFR calc Af Amer: 53 mL/min — ABNORMAL LOW (ref >60)
GFR calc non Af Amer: 46 mL/min — ABNORMAL LOW (ref >60)
Glucose, Bld: 127 mg/dL — ABNORMAL HIGH (ref 70–99)
Potassium: 2.9 mmol/L — ABNORMAL LOW (ref 3.5–5.1)
Sodium: 133 mmol/L — ABNORMAL LOW (ref 135–145)
Total Bilirubin: 0.4 mg/dL (ref 0.3–1.2)
Total Protein: 7.9 g/dL (ref 6.5–8.1)

## 2018-04-18 LAB — CBC WITH DIFFERENTIAL/PLATELET
Abs Immature Granulocytes: 0.06 10*3/uL (ref 0.00–0.07)
BASOS ABS: 0 10*3/uL (ref 0.0–0.1)
Basophils Relative: 0 %
Eosinophils Absolute: 0.1 10*3/uL (ref 0.0–0.5)
Eosinophils Relative: 1 %
HCT: 48.4 % — ABNORMAL HIGH (ref 36.0–46.0)
Hemoglobin: 16.6 g/dL — ABNORMAL HIGH (ref 12.0–15.0)
Immature Granulocytes: 1 %
LYMPHS ABS: 1 10*3/uL (ref 0.7–4.0)
Lymphocytes Relative: 10 %
MCH: 32.6 pg (ref 26.0–34.0)
MCHC: 34.3 g/dL (ref 30.0–36.0)
MCV: 95.1 fL (ref 80.0–100.0)
Monocytes Absolute: 0.6 10*3/uL (ref 0.1–1.0)
Monocytes Relative: 7 %
Neutro Abs: 7.5 10*3/uL (ref 1.7–7.7)
Neutrophils Relative %: 81 %
PLATELETS: 199 10*3/uL (ref 150–400)
RBC: 5.09 MIL/uL (ref 3.87–5.11)
RDW: 12.3 % (ref 11.5–15.5)
WBC: 9.2 10*3/uL (ref 4.0–10.5)
nRBC: 0 % (ref 0.0–0.2)

## 2018-04-18 LAB — MAGNESIUM: Magnesium: 1.8 mg/dL (ref 1.7–2.4)

## 2018-04-18 LAB — URINALYSIS, ROUTINE W REFLEX MICROSCOPIC
Bacteria, UA: NONE SEEN
Bilirubin Urine: NEGATIVE
Glucose, UA: NEGATIVE mg/dL
Ketones, ur: NEGATIVE mg/dL
Leukocytes,Ua: NEGATIVE
Nitrite: NEGATIVE
Protein, ur: NEGATIVE mg/dL
Specific Gravity, Urine: 1.009 (ref 1.005–1.030)
pH: 6 (ref 5.0–8.0)

## 2018-04-18 LAB — LACTIC ACID, PLASMA
LACTIC ACID, VENOUS: 1.1 mmol/L (ref 0.5–1.9)
Lactic Acid, Venous: 1.3 mmol/L (ref 0.5–1.9)

## 2018-04-18 LAB — LIPASE, BLOOD: LIPASE: 56 U/L — AB (ref 11–51)

## 2018-04-18 MED ORDER — SODIUM CHLORIDE 0.9 % IV BOLUS
1000.0000 mL | Freq: Once | INTRAVENOUS | Status: AC
  Administered 2018-04-18: 1000 mL via INTRAVENOUS

## 2018-04-18 MED ORDER — POTASSIUM CHLORIDE IN NACL 20-0.9 MEQ/L-% IV SOLN
INTRAVENOUS | Status: DC
  Administered 2018-04-18 – 2018-04-20 (×3): via INTRAVENOUS
  Filled 2018-04-18: qty 1000

## 2018-04-18 MED ORDER — ACETAMINOPHEN 325 MG PO TABS: 650.0000 mg | ORAL_TABLET | Freq: Four times a day (QID) | ORAL | Status: DC | PRN

## 2018-04-18 MED ORDER — SODIUM CHLORIDE 0.9 % IV SOLN
500.0000 mg | INTRAVENOUS | Status: DC
  Administered 2018-04-18 – 2018-04-19 (×2): 500 mg via INTRAVENOUS
  Filled 2018-04-18 (×2): qty 500

## 2018-04-18 MED ORDER — ACETAMINOPHEN 650 MG RE SUPP: 650.0000 mg | Freq: Four times a day (QID) | RECTAL | Status: DC | PRN

## 2018-04-18 MED ORDER — LOPERAMIDE HCL 2 MG PO CAPS
4.0000 mg | ORAL_CAPSULE | Freq: Once | ORAL | Status: AC
  Administered 2018-04-18: 4 mg via ORAL
  Filled 2018-04-18: qty 2

## 2018-04-18 MED ORDER — ONDANSETRON HCL 4 MG/2ML IJ SOLN
4.0000 mg | Freq: Once | INTRAMUSCULAR | Status: AC
  Administered 2018-04-18: 4 mg via INTRAVENOUS
  Filled 2018-04-18: qty 2

## 2018-04-18 MED ORDER — OSELTAMIVIR PHOSPHATE 30 MG PO CAPS
30.0000 mg | ORAL_CAPSULE | Freq: Two times a day (BID) | ORAL | Status: DC
  Administered 2018-04-18 – 2018-04-19 (×2): 30 mg via ORAL
  Filled 2018-04-18 (×2): qty 1

## 2018-04-18 MED ORDER — IPRATROPIUM-ALBUTEROL 0.5-2.5 (3) MG/3ML IN SOLN
3.0000 mL | Freq: Four times a day (QID) | RESPIRATORY_TRACT | Status: DC
  Administered 2018-04-18 – 2018-04-19 (×3): 3 mL via RESPIRATORY_TRACT
  Filled 2018-04-18 (×4): qty 3

## 2018-04-18 MED ORDER — ENOXAPARIN SODIUM 30 MG/0.3ML ~~LOC~~ SOLN
30.0000 mg | SUBCUTANEOUS | Status: DC
  Administered 2018-04-18: 30 mg via SUBCUTANEOUS
  Filled 2018-04-18: qty 0.3

## 2018-04-18 MED ORDER — HYDROCODONE-ACETAMINOPHEN 5-325 MG PO TABS
1.0000 | ORAL_TABLET | ORAL | Status: DC | PRN
  Administered 2018-04-18 – 2018-04-20 (×4): 2 via ORAL
  Filled 2018-04-18 (×4): qty 2

## 2018-04-18 MED ORDER — PNEUMOCOCCAL VAC POLYVALENT 25 MCG/0.5ML IJ INJ
0.5000 mL | INJECTION | INTRAMUSCULAR | Status: AC
  Administered 2018-04-19: 0.5 mL via INTRAMUSCULAR
  Filled 2018-04-18: qty 0.5

## 2018-04-18 MED ORDER — NICOTINE 21 MG/24HR TD PT24
21.0000 mg | MEDICATED_PATCH | Freq: Every day | TRANSDERMAL | Status: DC
  Filled 2018-04-18 (×2): qty 1

## 2018-04-18 MED ORDER — POTASSIUM CHLORIDE 10 MEQ/100ML IV SOLN
10.0000 meq | INTRAVENOUS | Status: AC
  Administered 2018-04-18 (×4): 10 meq via INTRAVENOUS
  Filled 2018-04-18 (×4): qty 100

## 2018-04-18 MED ORDER — DIPHENOXYLATE-ATROPINE 2.5-0.025 MG PO TABS: 2.0000 | ORAL_TABLET | Freq: Four times a day (QID) | ORAL | Status: DC | PRN

## 2018-04-18 MED ORDER — GUAIFENESIN ER 600 MG PO TB12
1200.0000 mg | ORAL_TABLET | Freq: Two times a day (BID) | ORAL | Status: DC
  Administered 2018-04-18 – 2018-04-20 (×5): 1200 mg via ORAL
  Filled 2018-04-18 (×5): qty 2

## 2018-04-18 MED ORDER — SODIUM CHLORIDE 0.9 % IV SOLN
1.0000 g | Freq: Once | INTRAVENOUS | Status: AC
  Administered 2018-04-18: 1 g via INTRAVENOUS

## 2018-04-18 MED ORDER — PROMETHAZINE HCL 12.5 MG PO TABS: 12.5000 mg | ORAL_TABLET | Freq: Four times a day (QID) | ORAL | Status: DC | PRN

## 2018-04-18 MED ORDER — TRAZODONE HCL 50 MG PO TABS
25.0000 mg | ORAL_TABLET | Freq: Every evening | ORAL | Status: DC | PRN
  Administered 2018-04-19: 25 mg via ORAL
  Filled 2018-04-18: qty 1

## 2018-04-18 NOTE — ED Provider Notes (Signed)
[facility] EMERGENCY DEPARTMENT Provider Note   CSN: [id number] Arrival date & time: 04/18/18  0500  Time seen 5:18 AM  History   Chief Complaint Chief Complaint  Patient presents with  . Weakness    HPI Lauren Mathis is a 48 y.o. female.     HPI patient states she started getting ill on February 23.  She has been having nausea and vomiting and dry heaves.  Her husband states she is having stomach acid come up however she states it does not burn.  She states she is having 3-4 episodes of watery diarrhea a day.  Her husband interjects that it is "uncontrollable".  She complains of diffuse abdominal pain.  She has had a dry cough.  She denies fever but has chills.  She complains of lots of body aches.  She denies sore throat or rhinorrhea.  She states she feels weak and she is dizzy and lightheaded on standing.  She states she is having decreased urinary output and a dry tongue.  She was seen in the ED on February 26 and started on Tamiflu.  They report the whole family has been ill all week.  Patient states she is supposed to be on blood pressure medication however she stopped it a year ago.  PCP PCP [facility] Department   Past Medical History:  Diagnosis Date  . COPD (chronic obstructive pulmonary disease) (HCC)   . Hypertension   . Migraine headache     Patient Active Problem List   Diagnosis Date Noted  . AKI (acute kidney injury) (HCC) 04/18/2018  . Dehydration 04/18/2018  . COPD (chronic obstructive pulmonary disease) (HCC) 04/18/2018  . Hypertension 04/18/2018  . Hypokalemia 04/18/2018  . Nausea and vomiting 04/18/2018  . Diarrhea 04/18/2018  . Influenza A 04/18/2018  . Smoker 04/18/2018    Past Surgical History:  Procedure Laterality Date  . TUBAL LIGATION       OB History    Gravida  3   Para  3   Term  3   Preterm      AB      Living        SAB      TAB      Ectopic      Multiple      Live Births                Home Medications    Prior to Admission medications   Medication Sig Start Date End Date Taking? Authorizing Provider  albuterol (PROVENTIL HFA;VENTOLIN HFA) 108 (90 Base) MCG/ACT inhaler Inhale 1-2 puffs into the lungs every 6 (six) hours as needed for wheezing or shortness of breath.   Yes [provider]  ibuprofen (ADVIL,MOTRIN) 200 MG tablet Take 1,000-1,200 mg by mouth every 6 (six) hours as needed for moderate pain.    Yes [provider]  nystatin cream (MYCOSTATIN) Apply to affected area 2 times daily 08/26/17  Yes Eber Hong, MD  ondansetron (ZOFRAN ODT) 4 MG disintegrating tablet Take 1 tablet (4 mg total) by mouth every 8 (eight) hours as needed for nausea or vomiting. 04/14/18  Yes Rancour, Jeannett Senior, MD  oseltamivir (TAMIFLU) 75 MG capsule Take 1 capsule (75 mg total) by mouth every 12 (twelve) hours. 04/14/18  Yes Rancour, Jeannett Senior, MD  predniSONE (DELTASONE) 20 MG tablet Take 2 tablets (40 mg total) by mouth daily. 08/26/17  Yes Eber Hong, MD    Family History Family History  Problem Relation Age  of Onset  . Asthma Mother   . Asthma Father     Social History Social History   Tobacco Use  . Smoking status: Current Every Day Smoker    Packs/day: 2.00    Types: Cigarettes  . Smokeless tobacco: Never Used  Substance Use Topics  . Alcohol use: Yes    Alcohol/week: 6.0 standard drinks    Types: 6 Cans of beer per week    Comment: 6-7 beers daily  . Drug use: No  Employed as a cook Patient states she drinks 5-6 beers, 12 ounces each, daily  Allergies   Patient has no known allergies.   Review of Systems Review of Systems  All other systems reviewed and are negative.    Physical Exam Updated Vital Signs BP (!) 143/86 (BP Location: Left Arm)   Pulse 70   Temp 98 F (36.7 C) (Oral)   Resp 16   Ht  (1.6 m)   Wt 52.2 kg   LMP 07/17/2015   SpO2 90%   BMI 20.37 kg/m   Vital signs normal    Physical Exam Vitals signs and  nursing note reviewed.  Constitutional:      Appearance: Normal appearance. She is normal weight. She is ill-appearing.  HENT:     Head: Normocephalic and atraumatic.     Right Ear: External ear normal.     Nose: Nose normal. No congestion.     Mouth/Throat:     Mouth: Mucous membranes are dry.  Eyes:     Extraocular Movements: Extraocular movements intact.     Conjunctiva/sclera: Conjunctivae normal.     Pupils: Pupils are equal, round, and reactive to light.  Neck:     Musculoskeletal: Normal range of motion.  Cardiovascular:     Rate and Rhythm: Normal rate and regular rhythm.     Pulses: Normal pulses.     Heart sounds: Normal heart sounds.  Pulmonary:     Effort: Pulmonary effort is normal. No respiratory distress.     Breath sounds: Normal breath sounds. No stridor. No wheezing, rhonchi or rales.  Abdominal:     Palpations: Abdomen is soft.     Tenderness: There is generalized abdominal tenderness. There is no guarding or rebound.  Musculoskeletal: Normal range of motion.        General: No deformity.  Skin:    General: Skin is warm and dry.     Findings: No rash.  Neurological:     General: No focal deficit present.     Mental Status: She is alert and oriented to person, place, and time.     Cranial Nerves: No cranial nerve deficit.  Psychiatric:        Mood and Affect: Mood normal.        Behavior: Behavior normal.        Thought Content: Thought content normal.      ED Treatments / Results  Labs (all labs ordered are listed, but only abnormal results are displayed)   Results for orders placed or performed during the hospital encounter of 04/18/18  Comprehensive metabolic panel  Result Value Ref Range   Sodium 133 (L) 135 - 145 mmol/L   Potassium 2.9 (L) 3.5 - 5.1 mmol/L   Chloride 96 (L) 98 - 111 mmol/L   CO2 22 22 - 32 mmol/L   Glucose, Bld 127 (H) 70 - 99 mg/dL   BUN 33 (H) 6 - 20 mg/dL   Creatinine, Ser 1.61 (H) 0.44 - 1.00  mg/dL   Calcium 9.1 8.9  - 09.8 mg/dL   Total Protein 7.9 6.5 - 8.1 g/dL   Albumin 4.0 3.5 - 5.0 g/dL   AST 53 (H) 15 - 41 U/L   ALT 34 0 - 44 U/L   Alkaline Phosphatase 83 38 - 126 U/L   Total Bilirubin 0.4 0.3 - 1.2 mg/dL   GFR calc non Af Amer 46 (L) >60 mL/min   GFR calc Af Amer 53 (L) >60 mL/min   Anion gap 15 5 - 15  Lipase, blood  Result Value Ref Range   Lipase 56 (H) 11 - 51 U/L  Lactic acid, plasma  Result Value Ref Range   Lactic Acid, Venous 1.1 0.5 - 1.9 mmol/L  CBC with Differential  Result Value Ref Range   WBC 9.2 4.0 - 10.5 K/uL   RBC 5.09 3.87 - 5.11 MIL/uL   Hemoglobin 16.6 (H) 12.0 - 15.0 g/dL   HCT 11.9 (H) 14.7 - 82.9 %   MCV 95.1 80.0 - 100.0 fL   MCH 32.6 26.0 - 34.0 pg   MCHC 34.3 30.0 - 36.0 g/dL   RDW 56.2 13.0 - 86.5 %   Platelets 199 150 - 400 K/uL   nRBC 0.0 0.0 - 0.2 %   Neutrophils Relative % 81 %   Neutro Abs 7.5 1.7 - 7.7 K/uL   Lymphocytes Relative 10 %   Lymphs Abs 1.0 0.7 - 4.0 K/uL   Monocytes Relative 7 %   Monocytes Absolute 0.6 0.1 - 1.0 K/uL   Eosinophils Relative 1 %   Eosinophils Absolute 0.1 0.0 - 0.5 K/uL   Basophils Relative 0 %   Basophils Absolute 0.0 0.0 - 0.1 K/uL   WBC Morphology VACUOLATED NEUTROPHILS    Immature Granulocytes 1 %   Abs Immature Granulocytes 0.06 0.00 - 0.07 K/uL   Reactive, Benign Lymphocytes PRESENT     Laboratory interpretation all normal except nonsignificant elevation of lipase, new acute renal insufficiency with elevation of BUN consistent with dehydration, low sodium and low potassium also consistent with dehydration, concentrated hemoglobin consistent with dehydration   Results for orders placed or performed during the hospital encounter of 04/14/18  Influenza panel by PCR (type A & B)  Result Value Ref Range   Influenza A By PCR POSITIVE (A) NEGATIVE   Influenza B By PCR NEGATIVE NEGATIVE  CBG monitoring, ED  Result Value Ref Range   Glucose-Capillary 115 (H) 70 - 99 mg/dL         EKG EKG  Interpretation  Date/Time:  Sunday April 18 2018 07:28:41 EST Ventricular Rate:  66 PR Interval:    QRS Duration: 91 QT Interval:  445 QTC Calculation: 467 R Axis:   89 Text Interpretation:  Sinus rhythm Nonspecific T abnormalities, lateral leads No significant change since last tracing 26 Aug 2017 Confirmed by Devoria Albe ([id number]) on 04/18/2018 7:35:35 AM   Radiology Dg Chest Port 1 View  Result Date: 04/18/2018 CLINICAL DATA:  Cough EXAM: PORTABLE CHEST 1 VIEW COMPARISON:  04/14/2018 FINDINGS: Cardiac shadow is within normal limits. The lungs are well aerated bilaterally. Mild bibasilar opacities are noted as well as peribronchial thickening. No sizable effusion is seen. IMPRESSION: Mild bibasilar opacities with peribronchial thickening new from the prior exam. Electronically Signed   By: Alcide Clever M.D.   On: 04/18/2018 07:46     Dg Chest 2 View  Result Date: 04/14/2018 CLINICAL DATA:  Flu-like symptoms for a week, cough and body aches. Marland Kitchen  IMPRESSION: No acute cardiopulmonary process. Electronically Signed   By: Awilda Metro M.D.   On: 04/14/2018 06:05   Procedures .Critical Care Performed by: Devoria Albe, MD Authorized by: Devoria Albe, MD   Critical care provider statement:    Critical care time (minutes):  31   Critical care was necessary to treat or prevent imminent or life-threatening deterioration of the following conditions:  Dehydration   Critical care was time spent personally by me on the following activities:  Discussions with consultants, examination of patient, obtaining history from patient or surrogate, ordering and review of laboratory studies, ordering and review of radiographic studies, pulse oximetry, re-evaluation of patient's condition and review of old charts   (including critical care time)  Medications Ordered in ED Medications  potassium chloride 10 mEq in 100 mL IVPB (10 mEq Intravenous New Bag/Given 04/18/18 0740)  cefTRIAXone (ROCEPHIN) 1 g in sodium  chloride 0.9 % 100 mL IVPB (has no administration in time range)  azithromycin (ZITHROMAX) 500 mg in sodium chloride 0.9 % 250 mL IVPB (has no administration in time range)  sodium chloride 0.9 % bolus 1,000 mL (0 mLs Intravenous Stopped 04/18/18 0716)  sodium chloride 0.9 % bolus 1,000 mL (0 mLs Intravenous Stopped 04/18/18 0716)  ondansetron (ZOFRAN) injection 4 mg (4 mg Intravenous Given 04/18/18 0555)  loperamide (IMODIUM) capsule 4 mg (4 mg Oral Given 04/18/18 0556)  sodium chloride 0.9 % bolus 1,000 mL (1,000 mLs Intravenous New Bag/Given 04/18/18 9470)     Initial Impression / Assessment and Plan / ED Course  I have reviewed the triage vital signs and the nursing notes.  Pertinent labs & imaging results that were available during my care of the patient were reviewed by me and considered in my medical decision making (see chart for details).   Patient was given IV fluids for dehydration.  She was given IV Zofran for nausea.  She was given oral Imodium for her diarrhea.  After reviewing her labs I rechecked patient at 7 AM.  She has had 2 L IV fluid without urinary output.  She states she is not feeling any different.  She states "I cannot continue to live this way".  She was given more IV fluids.  She states she is hardly coughing her main problems are the vomiting and diarrhea.  Portable chest was done just to make sure there was no underlying pneumonia and then I will talk to the hospitalist about admission.    Patient continues to feel bad, I was going to give her oral potassium but elected to give her IV potassium instead.  EKG was done due to the mild hypokalemia.  Magnesium level was added to her blood work.  7:55 AM Dr. Laural Benes, hospitalist will admit.  By this point her chest x-ray had been read and possible bibasilar infiltrates, I ordered her antibiotics for community-acquired pneumonia.   Final Clinical Impressions(s) / ED Diagnoses   Final diagnoses:  Generalized weakness   Hypokalemia  Dehydration  Vomiting and diarrhea  Acute renal failure, unspecified acute renal failure type (HCC)  Community acquired pneumonia, unspecified laterality    Plan admission  Devoria Albe, MD, Concha Pyo, MD 04/18/18 [phone]

## 2018-04-18 NOTE — ED Notes (Signed)
Placed pt on 2L nasal cannula. Pt c/o SOB and sats 89-90% on RA.

## 2018-04-18 NOTE — H&P (Signed)
History and Physical  Lauren Mathis CVE:[id number] DOB: September 04, 1970 DOA: 04/18/2018  Referring physician: Lynelle Doctor MD  PCP: [facility]   Chief Complaint: nausea and vomiting  HPI: Lauren Mathis is a 48 y.o. female who unfortunately has been dealing with symptoms of influenza A since getting ill on February 23.  She was recently diagnosed from an emergency department visit.  She has been having symptoms of nausea vomiting and dry heaves.  She is also been having diarrhea.  She has been ill and having difficulty with eating and drinking.  She also reports having a low-grade fever.  She has had no shortness of breath or difficulty breathing over the last several days but most of her symptoms have off GI symptoms as noted.  She reports weakness and dizziness.  She is having the sensation of lightheadedness with standing up.  She denies chills.  She also reports body aches.  She reports that other family members have also been ill with a similar presentation.  She is a heavy smoker reporting 2 packs/day.  She reports a history of hypertension but has been off medications for approximately 1 year.  She says that [facility].  ED Course: The patient was evaluated and noted to be moderately ill and noted to have some electrolyte abnormalities.  Her potassium was 2.9.  Sodium 133.  Creatinine 1.36.  BUN was elevated at 3.  Lipase was elevated at 56.  White blood cell count 9.2 hemoglobin 16.6 platelet count 199.  Her lactic acid was 1.1.  Blood pressure 136/90, temperature 98.0 pulse ox 91% on room air.  Chest x-ray reveals bilateral opacities that are new.  Her influenza A test was positive from 04/13/2018.  Review of Systems: All systems reviewed and apart from history of presenting illness, are negative.  Past Medical History:  Diagnosis Date  . COPD (chronic obstructive pulmonary disease) (HCC)   . Hypertension    . Migraine headache    Past Surgical History:  Procedure Laterality Date  . TUBAL LIGATION     Social History:  reports that she has been smoking cigarettes. She has been smoking about 2.00 packs per day. She has never used smokeless tobacco. She reports current alcohol use of about 6.0 standard drinks of alcohol per week. She reports that she does not use drugs.  No Known Allergies  Family History  Problem Relation Age of Onset  . Asthma Mother   . Asthma Father     Prior to Admission medications   Medication Sig Start Date End Date Taking? Authorizing Provider  albuterol (PROVENTIL HFA;VENTOLIN HFA) 108 (90 Base) MCG/ACT inhaler Inhale 1-2 puffs into the lungs every 6 (six) hours as needed for wheezing or shortness of breath.   Yes [provider]  ibuprofen (ADVIL,MOTRIN) 200 MG tablet Take 1,000-1,200 mg by mouth every 6 (six) hours as needed for moderate pain.    Yes [provider]  nystatin cream (MYCOSTATIN) Apply to affected area 2 times daily 08/26/17  Yes Eber Hong, MD  ondansetron (ZOFRAN ODT) 4 MG disintegrating tablet Take 1 tablet (4 mg total) by mouth every 8 (eight) hours as needed for nausea or vomiting. 04/14/18  Yes Rancour, Jeannett Senior, MD  oseltamivir (TAMIFLU) 75 MG capsule Take 1 capsule (75 mg total) by mouth every 12 (twelve) hours. 04/14/18  Yes Rancour, Jeannett Senior, MD  predniSONE (DELTASONE) 20 MG tablet Take 2 tablets (40 mg  total) by mouth daily. 08/26/17  Yes Eber Hong, MD   Physical Exam: Vitals:   04/18/18 0915 04/18/18 0930 04/18/18 1000 04/18/18 1030  BP:  (!) 145/82 (!) 150/102 (!) 161/90  Pulse: 72 69 68 64  Resp: (!) 21  Temp:      TempSrc:      SpO2: 97% 98% 98% 99%  Weight:      Height:         General exam: Very thin emaciated and chronically ill-appearing patient, patient appears older than stated age, lying comfortably supine on the gurney in mild to moderate distress.  Head, eyes and ENT: Nontraumatic and  normocephalic. Pupils equally reacting to light and accommodation. Oral mucosa very dry.  Neck: Supple. No JVD, carotid bruit or thyromegaly.  Lymphatics: No lymphadenopathy.  Respiratory system: Shallow breath sounds bilateral no rales heard no wheezing heard. No increased work of breathing.  Cardiovascular system: S1 and S2 heard, RRR. No JVD, murmurs, gallops, clicks or pedal edema.  Gastrointestinal system: Abdomen is nondistended, soft and nontender. Normal bowel sounds heard. No organomegaly or masses appreciated.  Central nervous system: Alert and oriented. No focal neurological deficits.  Extremities: Symmetric 5 x 5 power. Peripheral pulses symmetrically felt.   Skin: No rashes or acute findings.  Musculoskeletal system: Negative exam.  Psychiatry: Pleasant and cooperative.  Labs on Admission:  Basic Metabolic Panel: Recent Labs  Lab 04/18/18 0528 04/18/18 0804  NA 133*  --   K 2.9*  --   CL 96*  --   CO2 22  --   GLUCOSE 127*  --   BUN 33*  --   CREATININE 1.36*  --   CALCIUM 9.1  --   MG  --  1.8   Liver Function Tests: Recent Labs  Lab 04/18/18 0528  AST 53*  ALT 34  ALKPHOS 83  BILITOT 0.4  PROT 7.9  ALBUMIN 4.0   Recent Labs  Lab 04/18/18 0528  LIPASE 56*   No results for input(s): AMMONIA in the last 168 hours. CBC: Recent Labs  Lab 04/18/18 0528  WBC 9.2  NEUTROABS 7.5  HGB 16.6*  HCT 48.4*  MCV 95.1  PLT 199   Cardiac Enzymes: No results for input(s): CKTOTAL, CKMB, CKMBINDEX, TROPONINI in the last 168 hours.  BNP (last 3 results) No results for input(s): PROBNP in the last 8760 hours. CBG: Recent Labs  Lab 04/14/18 0608  GLUCAP 115*    Radiological Exams on Admission: Dg Chest Port 1 View  Result Date: 04/18/2018 CLINICAL DATA:  Cough EXAM: PORTABLE CHEST 1 VIEW COMPARISON:  04/14/2018 FINDINGS: Cardiac shadow is within normal limits. The lungs are well aerated bilaterally. Mild bibasilar opacities are noted as well as  peribronchial thickening. No sizable effusion is seen. IMPRESSION: Mild bibasilar opacities with peribronchial thickening new from the prior exam. Electronically Signed   By: Alcide Clever M.D.   On: 04/18/2018 07:46   Assessment/Plan Active Problems:   AKI (acute kidney injury) (HCC)   Dehydration   COPD (chronic obstructive pulmonary disease) (HCC)   Hypertension   Hypokalemia   Nausea and vomiting   Diarrhea   Influenza A   Smoker  1. Acute kidney injury-likely prerenal secondary to dehydration-treating with IV fluids and monitor BMP. 2. Influenza A- continue supportive therapy continue Tamiflu twice daily as ordered continue supplemental oxygen if needed. 3. Community-acquired pneumonia-continue ceftriaxone and azithromycin as ordered and supportive therapy. 4. Nausea vomiting and diarrhea-secondary to influenza infection-treating supportively.  5. Chronic active nicotine dependence- patient reports smoking 2 packs/day of cigarettes, will counsel on smoking cessation but provide that for cravings while in hospital.  Mucolytic's ordered and scheduled nebulizer treatments ordered. 6. Hypokalemia-likely from GI losses- replacement ordered.  Follow potassium and magnesium. 7. COPD- scheduled nebulizer treatments ordered.  Smoking cessation. 8. History of hypertension-currently not on medications, follow blood pressure and treat as needed. 9. Dehydration- continue IV fluid hydration.  DVT Prophylaxis: Lovenox Code Status: Full Family Communication: Patient bedside Disposition Plan: Home when medically stabilized  Time spent: 58 minutes  Raeford Brandenburg Laural Benes, MD [facility] How to contact the [facility] Attending or Consulting provider 7A - 7P or covering provider during after hours 7P -7A, for this patient?  1. Check the care team in [facility] and look for a) attending/consulting [facility] provider listed and b) the [facility] team listed 2. Log into [email or web address] and use [place]'s universal password to  access. If you do not have the password, please contact the hospital operator. 3. Locate the [facility] provider you are looking for under [facility] and page to a number that you can be directly reached. 4. If you still have difficulty reaching the provider, please page the [facility] (Director on Call) for the Hospitalists listed on amion for assistance.

## 2018-04-18 NOTE — ED Triage Notes (Signed)
Pt seen here Wednesday and diagnosed with Flu. Pt here today with increased weakness and states she is unable to keep anything down. Pt also states she is having uncontrollable diarrhea.

## 2018-04-18 NOTE — ED Notes (Signed)
Pt given ice chips per request

## 2018-04-18 NOTE — ED Notes (Signed)
Hospitalist at bedside 

## 2018-04-18 NOTE — ED Notes (Signed)
RT notified for duoneb 

## 2018-04-18 NOTE — ED Notes (Signed)
Rocephin and Zithromax out of stock in [facility]. Pharmacy notified.

## 2018-04-19 DIAGNOSIS — I10 Essential (primary) hypertension: Secondary | ICD-10-CM

## 2018-04-19 LAB — CBC WITH DIFFERENTIAL/PLATELET
Abs Immature Granulocytes: 0.11 10*3/uL — ABNORMAL HIGH (ref 0.00–0.07)
BASOS ABS: 0 10*3/uL (ref 0.0–0.1)
Basophils Relative: 0 %
Eosinophils Absolute: 0 10*3/uL (ref 0.0–0.5)
Eosinophils Relative: 0 %
HEMATOCRIT: 36 % (ref 36.0–46.0)
Hemoglobin: 11.9 g/dL — ABNORMAL LOW (ref 12.0–15.0)
IMMATURE GRANULOCYTES: 2 %
LYMPHS ABS: 1.2 10*3/uL (ref 0.7–4.0)
Lymphocytes Relative: 16 %
MCH: 33 pg (ref 26.0–34.0)
MCHC: 33.1 g/dL (ref 30.0–36.0)
MCV: 99.7 fL (ref 80.0–100.0)
Monocytes Absolute: 0.4 10*3/uL (ref 0.1–1.0)
Monocytes Relative: 5 %
NRBC: 0 % (ref 0.0–0.2)
Neutro Abs: 5.8 10*3/uL (ref 1.7–7.7)
Neutrophils Relative %: 77 %
Platelets: 219 10*3/uL (ref 150–400)
RBC: 3.61 MIL/uL — ABNORMAL LOW (ref 3.87–5.11)
RDW: 12.5 % (ref 11.5–15.5)
WBC: 7.5 10*3/uL (ref 4.0–10.5)

## 2018-04-19 LAB — COMPREHENSIVE METABOLIC PANEL
ALT: 18 U/L (ref 0–44)
AST: 24 U/L (ref 15–41)
Albumin: 2.6 g/dL — ABNORMAL LOW (ref 3.5–5.0)
Alkaline Phosphatase: 50 U/L (ref 38–126)
Anion gap: 7 (ref 5–15)
BUN: 9 mg/dL (ref 6–20)
CO2: 19 mmol/L — ABNORMAL LOW (ref 22–32)
Calcium: 7.4 mg/dL — ABNORMAL LOW (ref 8.9–10.3)
Chloride: 113 mmol/L — ABNORMAL HIGH (ref 98–111)
Creatinine, Ser: 0.6 mg/dL (ref 0.44–1.00)
GFR calc non Af Amer: >60 mL/min (ref >60)
Glucose, Bld: 97 mg/dL (ref 70–99)
Potassium: 3.5 mmol/L (ref 3.5–5.1)
Sodium: 139 mmol/L (ref 135–145)
Total Bilirubin: 0.2 mg/dL — ABNORMAL LOW (ref 0.3–1.2)
Total Protein: 5.3 g/dL — ABNORMAL LOW (ref 6.5–8.1)

## 2018-04-19 LAB — MAGNESIUM: Magnesium: 1.9 mg/dL (ref 1.7–2.4)

## 2018-04-19 MED ORDER — ENOXAPARIN SODIUM 40 MG/0.4ML ~~LOC~~ SOLN
40.0000 mg | SUBCUTANEOUS | Status: DC
  Administered 2018-04-19: 40 mg via SUBCUTANEOUS
  Filled 2018-04-19: qty 0.4

## 2018-04-19 MED ORDER — OSELTAMIVIR PHOSPHATE 75 MG PO CAPS
75.0000 mg | ORAL_CAPSULE | Freq: Two times a day (BID) | ORAL | Status: DC
  Administered 2018-04-19 – 2018-04-20 (×3): 75 mg via ORAL
  Filled 2018-04-19 (×3): qty 1

## 2018-04-19 MED ORDER — PANTOPRAZOLE SODIUM 40 MG PO TBEC
40.0000 mg | DELAYED_RELEASE_TABLET | Freq: Two times a day (BID) | ORAL | Status: DC
  Administered 2018-04-19 – 2018-04-20 (×3): 40 mg via ORAL
  Filled 2018-04-19 (×3): qty 1

## 2018-04-19 MED ORDER — ALUM & MAG HYDROXIDE-SIMETH 200-200-20 MG/5ML PO SUSP
30.0000 mL | Freq: Four times a day (QID) | ORAL | Status: DC | PRN
  Administered 2018-04-19: 30 mL via ORAL
  Filled 2018-04-19: qty 30

## 2018-04-19 MED ORDER — ONDANSETRON HCL 4 MG/2ML IJ SOLN
4.0000 mg | Freq: Four times a day (QID) | INTRAMUSCULAR | Status: DC | PRN
  Administered 2018-04-19: 4 mg via INTRAVENOUS
  Filled 2018-04-19: qty 2

## 2018-04-19 MED ORDER — IPRATROPIUM-ALBUTEROL 0.5-2.5 (3) MG/3ML IN SOLN
3.0000 mL | Freq: Four times a day (QID) | RESPIRATORY_TRACT | Status: DC | PRN
  Filled 2018-04-19: qty 3

## 2018-04-19 NOTE — Progress Notes (Signed)
PROGRESS NOTE  Lauren Mathis  VVK:[id number]  DOB: Nov 04, 1970  DOA: 04/18/2018 PCP: [facility], [facility]   Brief Admission Hx: 48 y.o. female who unfortunately has been dealing with symptoms of influenza A presented with dehydration, nausea vomiting and gastroenteritis symptoms.  She was also started on treatment for bilateral pneumonia.  MDM/Assessment & Plan:   1. Acute kidney injury- RESOLVED.  Likely prerenal secondary to dehydration-treating with IV fluids and monitor BMP. 2. Influenza A- continue supportive therapy continue Tamiflu twice daily as ordered continue supplemental oxygen if needed. 3. Bilateral community-acquired pneumonia-continue ceftriaxone and azithromycin as ordered and supportive therapy. 4. Nausea vomiting and diarrhea-secondary to influenza infection-treating supportively. 5. GERD- added Protonix and Maalox for symptomatic treatment. 6. Chronic active nicotine dependence- patient reports smoking 2 packs/day of cigarettes, will counsel on smoking cessation but provide that for cravings while in hospital.  Mucolytics ordered and scheduled nebulizer treatments ordered. 7. Hypokalemia-REPLETED. Likely from GI losses- replace as needed. Follow potassium and magnesium. 8. COPD- scheduled nebulizer treatments ordered.  Smoking cessation. 9. History of hypertension-currently not on medications, follow blood pressure and treat as needed. 10. Dehydration- continue IV fluid hydration.  DVT Prophylaxis: Lovenox Code Status: Full Family Communication: Patient bedside Disposition Plan: Home when medically stabilized  Consultants:  n/a  Procedures:  n/a  Antimicrobials:  Ceftriaxone 3/1 >  Azithromycin 3/1 >   Subjective: Patient complaining of abdominal pain and chest pain and acid reflux symptoms and belching.  The diarrhea has mostly resolved.  She is eating some but has not been eating much.  She is drinking sips of clear  liquids.  Objective: Vitals:   04/18/18 2253 04/19/18 0603 04/19/18 0748 04/19/18 1337  BP:  139/87  140/88  Pulse: (!) 56 (!) 59  (!) 52  Resp: 18   17  Temp:  97.8 F (36.6 C)  97.9 F (36.6 C)  TempSrc:  Oral  Oral  SpO2: 97% 100% 99% 95%  Weight:      Height:        Intake/Output Summary (Last 24 hours) at 04/19/2018 1535 Last data filed at 04/19/2018 1204 Gross per 24 hour  Intake 1730.22 ml  Output -  Net 1730.22 ml   Filed Weights   04/18/18 0508 04/18/18 2009  Weight: 52.2 kg 49.9 kg     REVIEW OF SYSTEMS  As per history otherwise all reviewed and reported negative  Exam:  General exam: Thin emaciated female awake and alert and crying sitting up in the bed. Respiratory system: Bibasilar Rales. No increased work of breathing. Cardiovascular system: S1 & S2 heard. No JVD, murmurs, gallops, clicks or pedal edema. Gastrointestinal system: Abdomen is nondistended, soft and nontender. Normal bowel sounds heard. Central nervous system: Alert and oriented. No focal neurological deficits. Extremities: no CCE.  Data Reviewed: Basic Metabolic Panel: Recent Labs  Lab 04/18/18 0528 04/18/18 0804 04/19/18 0614  NA 133*  --  139  K 2.9*  --  3.5  CL 96*  --  113*  CO2 22  --  19*  GLUCOSE 127*  --  97  BUN 33*  --  9  CREATININE 1.36*  --  0.60  CALCIUM 9.1  --  7.4*  MG  --  1.8 1.9   Liver Function Tests: Recent Labs  Lab 04/18/18 0528 04/19/18 0614  AST 53* 24  ALT 34 18  ALKPHOS 83 50  BILITOT 0.4 0.2*  PROT 7.9 5.3*  ALBUMIN 4.0 2.6*   Recent Labs  Lab 04/18/18 0528  LIPASE 56*   No results for input(s): AMMONIA in the last 168 hours. CBC: Recent Labs  Lab 04/18/18 0528 04/19/18 0614  WBC 9.2 7.5  NEUTROABS 7.5 5.8  HGB 16.6* 11.9*  HCT 48.4* 36.0  MCV 95.1 99.7  PLT 199 219   Cardiac Enzymes: No results for input(s): CKTOTAL, CKMB, CKMBINDEX, TROPONINI in the last 168 hours. CBG (last 3)  No results for input(s): GLUCAP in the  last 72 hours. No results found for this or any previous visit (from the past 240 hour(s)).   Studies: Dg Chest Port 1 View  Result Date: 04/18/2018 CLINICAL DATA:  Cough EXAM: PORTABLE CHEST 1 VIEW COMPARISON:  04/14/2018 FINDINGS: Cardiac shadow is within normal limits. The lungs are well aerated bilaterally. Mild bibasilar opacities are noted as well as peribronchial thickening. No sizable effusion is seen. IMPRESSION: Mild bibasilar opacities with peribronchial thickening new from the prior exam. Electronically Signed   By: Alcide Clever M.D.   On: 04/18/2018 07:46   Scheduled Meds: . enoxaparin (LOVENOX) injection  40 mg Subcutaneous Q24H  . guaiFENesin  1,200 mg Oral BID  . nicotine  21 mg Transdermal Daily  . oseltamivir  75 mg Oral Q12H  . pantoprazole  40 mg Oral BID AC   Continuous Infusions: . 0.9 % NaCl with KCl 20 mEq / L 100 mL/hr at 04/18/18 2228  . azithromycin 500 mg (04/19/18 0754)    Active Problems:   AKI (acute kidney injury) (HCC)   Dehydration   COPD (chronic obstructive pulmonary disease) (HCC)   Hypertension   Hypokalemia   Nausea and vomiting   Diarrhea   Influenza A   Smoker  Time spent:   Standley Dakins, MD [facility] 04/19/2018, 3:35 PM    LOS: 1 day  How to contact the [facility] Attending or Consulting provider 7A - 7P or covering provider during after hours 7P -7A, for this patient?  1. Check the care team in [facility] and look for a) attending/consulting TRH provider listed and b) the [facility] team listed 2. Log into [email or web address] and use [place]'s universal password to access. If you do not have the password, please contact the hospital operator. 3. Locate the [facility] provider you are looking for under [facility] and page to a number that you can be directly reached. 4. If you still have difficulty reaching the provider, please page the [facility] (Director on Call) for the Hospitalists listed on amion for assistance.

## 2018-04-20 LAB — CBC WITH DIFFERENTIAL/PLATELET
Abs Immature Granulocytes: 0.15 10*3/uL — ABNORMAL HIGH (ref 0.00–0.07)
Basophils Absolute: 0 10*3/uL (ref 0.0–0.1)
Basophils Relative: 0 %
Eosinophils Absolute: 0 10*3/uL (ref 0.0–0.5)
Eosinophils Relative: 0 %
HCT: 35.9 % — ABNORMAL LOW (ref 36.0–46.0)
Hemoglobin: 11.7 g/dL — ABNORMAL LOW (ref 12.0–15.0)
Immature Granulocytes: 2 %
Lymphocytes Relative: 15 %
Lymphs Abs: 1 10*3/uL (ref 0.7–4.0)
MCH: 32.8 pg (ref 26.0–34.0)
MCHC: 32.6 g/dL (ref 30.0–36.0)
MCV: 100.6 fL — ABNORMAL HIGH (ref 80.0–100.0)
Monocytes Absolute: 0.4 10*3/uL (ref 0.1–1.0)
Monocytes Relative: 6 %
NRBC: 0 % (ref 0.0–0.2)
Neutro Abs: 5.2 10*3/uL (ref 1.7–7.7)
Neutrophils Relative %: 77 %
Platelets: 300 10*3/uL (ref 150–400)
RBC: 3.57 MIL/uL — ABNORMAL LOW (ref 3.87–5.11)
RDW: 12.9 % (ref 11.5–15.5)
WBC: 6.8 10*3/uL (ref 4.0–10.5)

## 2018-04-20 LAB — COMPREHENSIVE METABOLIC PANEL
ALT: 14 U/L (ref 0–44)
AST: 24 U/L (ref 15–41)
Albumin: 2.5 g/dL — ABNORMAL LOW (ref 3.5–5.0)
Alkaline Phosphatase: 58 U/L (ref 38–126)
Anion gap: 7 (ref 5–15)
BUN: 5 mg/dL — ABNORMAL LOW (ref 6–20)
CO2: 21 mmol/L — ABNORMAL LOW (ref 22–32)
Calcium: 7.6 mg/dL — ABNORMAL LOW (ref 8.9–10.3)
Chloride: 110 mmol/L (ref 98–111)
Creatinine, Ser: 0.58 mg/dL (ref 0.44–1.00)
GFR calc non Af Amer: >60 mL/min (ref >60)
Glucose, Bld: 84 mg/dL (ref 70–99)
Potassium: 3.4 mmol/L — ABNORMAL LOW (ref 3.5–5.1)
Sodium: 138 mmol/L (ref 135–145)
Total Bilirubin: 0.2 mg/dL — ABNORMAL LOW (ref 0.3–1.2)
Total Protein: 5.4 g/dL — ABNORMAL LOW (ref 6.5–8.1)

## 2018-04-20 LAB — HIV ANTIBODY (ROUTINE TESTING W REFLEX): HIV Screen 4th Generation wRfx: NONREACTIVE

## 2018-04-20 LAB — MAGNESIUM: Magnesium: 1.7 mg/dL (ref 1.7–2.4)

## 2018-04-20 MED ORDER — POTASSIUM CHLORIDE CRYS ER 20 MEQ PO TBCR
40.0000 meq | EXTENDED_RELEASE_TABLET | Freq: Once | ORAL | Status: AC
  Administered 2018-04-20: 40 meq via ORAL
  Filled 2018-04-20: qty 2

## 2018-04-20 MED ORDER — OSELTAMIVIR PHOSPHATE 75 MG PO CAPS: 75.0000 mg | ORAL_CAPSULE | Freq: Two times a day (BID) | ORAL | 0 refills | Status: DC

## 2018-04-20 MED ORDER — IBUPROFEN 200 MG PO TABS: 200.0000 mg | ORAL_TABLET | Freq: Three times a day (TID) | ORAL | 0 refills | Status: DC | PRN

## 2018-04-20 MED ORDER — DOXYCYCLINE HYCLATE 100 MG PO TABS: 100.0000 mg | ORAL_TABLET | Freq: Two times a day (BID) | ORAL | 0 refills | Status: AC

## 2018-04-20 MED ORDER — DOXYCYCLINE HYCLATE 100 MG PO TABS
100.0000 mg | ORAL_TABLET | Freq: Two times a day (BID) | ORAL | Status: DC
  Administered 2018-04-20: 100 mg via ORAL
  Filled 2018-04-20: qty 1

## 2018-04-20 MED ORDER — OSELTAMIVIR PHOSPHATE 75 MG PO CAPS: 75.0000 mg | ORAL_CAPSULE | Freq: Two times a day (BID) | ORAL | 0 refills | Status: AC

## 2018-04-20 NOTE — Care Management Note (Signed)
Case Management Note  Patient Details  Name: Lauren Mathis MRN: [id number] Date of Birth: 01-13-1971  Subjective/Objective:           Admitted with flu and AKI. Pt from home, ind pta. Uninsured, goes to [facility] Dept for PCP care. Pt will DC home tamiflu.         Action/Plan: DC home today. MATCH provided for assistance with cost of tamiflu.  Expected Discharge Date:  04/22/18               Expected Discharge Plan:  Home/Self Care  In-House Referral:  Financial Counselor  Discharge planning Services  CM Consult, [facility]  Post Acute Care Choice:  NA Choice offered to:  NA  Status of Service:  Completed, signed off  Malcolm Metro, RN 04/20/2018, 11:22 AM

## 2018-04-20 NOTE — Discharge Instructions (Signed)
IMPORTANT INFORMATION: PAY CLOSE ATTENTION  ° °PHYSICIAN DISCHARGE INSTRUCTIONS ° °Follow with Primary care provider  [facility], [place] [facility]  and other consultants as instructed your Hospitalist Physician ° °SEEK MEDICAL CARE OR RETURN TO EMERGENCY ROOM IF SYMPTOMS COME BACK, WORSEN OR NEW PROBLEM DEVELOPS.  ° °Please note: °You were cared for by a hospitalist during your hospital stay. Every effort will be made to forward records to your primary care provider.  You can request that your primary care provider send for your hospital records if they have not received them.  Once you are discharged, your primary care physician will handle any further medical issues. Please note that NO REFILLS for any discharge medications will be authorized once you are discharged, as it is imperative that you return to your primary care physician (or establish a relationship with a primary care physician if you do not have one) for your post hospital discharge needs so that they can reassess your need for medications and monitor your lab values. ° °Please get a complete blood count and chemistry panel checked by your Primary MD at your next visit, and again as instructed by your Primary MD. ° °Get Medicines reviewed and adjusted: °Please take all your medications with you for your next visit with your Primary MD ° °Laboratory/radiological data: °Please request your Primary MD to go over all hospital tests and procedure/radiological results at the follow up, please ask your primary care provider to get all Hospital records sent to his/her office. ° °In some cases, they will be blood work, cultures and biopsy results pending at the time of your discharge. Please request that your primary care provider follow up on these results. ° °If you are diabetic, please bring your blood sugar readings with you to your follow up appointment with primary care.   ° °Please call and make your follow up appointments as soon as possible.    ° °Also Note the following: °If you experience worsening of your admission symptoms, develop shortness of breath, life threatening emergency, suicidal or homicidal thoughts you must seek medical attention immediately by calling 911 or calling your MD immediately  if symptoms less severe. ° °You must read complete instructions/literature along with all the possible adverse reactions/side effects for all the Medicines you take and that have been prescribed to you. Take any new Medicines after you have completely understood and accpet all the possible adverse reactions/side effects.  ° °Do not drive when taking Pain medications or sleeping medications (Benzodiazepines) ° °Do not take more than prescribed Pain, Sleep and Anxiety Medications. It is not advisable to combine anxiety,sleep and pain medications without talking with your primary care practitioner ° °Special Instructions: If you have smoked or chewed Tobacco  in the last 2 yrs please stop smoking, stop any regular Alcohol  and or any Recreational drug use. ° °Wear Seat belts while driving. ° ° ° ° ° ° °

## 2018-04-20 NOTE — Discharge Summary (Signed)
Physician Discharge Summary  Lauren Mathis:[id number] DOB: 11/14/1970 DOA: 04/18/2018  PCP: [facility]  Admit date: 04/18/2018 Discharge date: 04/20/2018  Admitted From: Home  Disposition: Home   Recommendations for Outpatient Follow-up:  1. Follow up with PCP in 1 weeks   Discharge Condition: STABLE   CODE STATUS: FULL    Brief Hospitalization Summary: Please see all hospital notes, images, labs for full details of the hospitalization.  HPI: Lauren Mathis is a 48 y.o. female who unfortunately has been dealing with symptoms of influenza A since getting ill on February 23.  She was recently diagnosed from an emergency department visit.  She has been having symptoms of nausea vomiting and dry heaves.  She is also been having diarrhea.  She has been ill and having difficulty with eating and drinking.  She also reports having a low-grade fever.  She has had no shortness of breath or difficulty breathing over the last several days but most of her symptoms have off GI symptoms as noted.  She reports weakness and dizziness.  She is having the sensation of lightheadedness with standing up.  She denies chills.  She also reports body aches.  She reports that other family members have also been ill with a similar presentation.  She is a heavy smoker reporting 2 packs/day.  She reports a history of hypertension but has been off medications for approximately 1 year.  She says that [facility].  ED Course: The patient was evaluated and noted to be moderately ill and noted to have some electrolyte abnormalities.  Her potassium was 2.9.  Sodium 133.  Creatinine 1.36.  BUN was elevated at 3.  Lipase was elevated at 56.  White blood cell count 9.2 hemoglobin 16.6 platelet count 199.  Her lactic acid was 1.1.  Blood pressure 136/90, temperature 98.0 pulse ox 91% on room air.  Chest x-ray reveals bilateral opacities that  are new.  Her influenza A test was positive from 04/13/2018.  Brief Admission Hx: 48 y.o.femalewho unfortunately has been dealing with symptoms of influenza A presented with dehydration, nausea vomiting and gastroenteritis symptoms.  She was also started on treatment for bilateral pneumonia.  MDM/Assessment & Plan:   1. Acute kidney injury- RESOLVED.  Likely prerenal secondary to dehydration-treating with IV fluids and monitor BMP. 2. Influenza A- continue supportive therapy continue Tamiflu twice daily as ordered continue supplemental oxygen if needed. Tamiflu ordered for total of 5 days.   3. Bilateral community-acquired pneumonia-continue ceftriaxone and azithromycin as ordered and supportive therapy. Discharge home on oral doxycycline.  4. Nausea vomiting and diarrhea-secondary to influenza infection-treating supportively. 5. GERD- added Protonix and Maalox for symptomatic treatment. 6. Chronic active nicotine dependence- patient reports smoking 2 packs/day of cigarettes, will counsel on smoking cessation but provide that for cravings while in hospital. Mucolytics ordered and scheduled nebulizer treatments ordered. 7. Hypokalemia-REPLETED. Likely from GI losses- replace as needed.Follow potassium and magnesium. 8. COPD- scheduled nebulizer treatments ordered. Smoking cessation. 9. History of hypertension-currently not on medications, follow blood pressure and treat as needed. 10. Dehydration- continue IV fluid hydration.  DVT Prophylaxis:Lovenox Code Status:Full Family Communication:Patient bedside Disposition Plan:Home  Consultants:  n/a  Procedures:  n/a  Antimicrobials:  Ceftriaxone 3/1 >  Azithromycin 3/1 >   Discharge Diagnoses:  Active Problems:   AKI (acute kidney injury) (HCC)   Dehydration   COPD (chronic obstructive pulmonary disease) (HCC)   Hypertension  Hypokalemia   Nausea and vomiting   Diarrhea   Influenza A   Smoker  Discharge  Instructions: Discharge Instructions    Call MD for:  difficulty breathing, headache or visual disturbances   Complete by:  As directed    Call MD for:  extreme fatigue   Complete by:  As directed    Call MD for:  persistant dizziness or light-headedness   Complete by:  As directed    Call MD for:  persistant nausea and vomiting   Complete by:  As directed    Call MD for:  severe uncontrolled pain   Complete by:  As directed    Increase activity slowly   Complete by:  As directed      Allergies as of 04/20/2018   No Known Allergies     Medication List    TAKE these medications   albuterol 108 (90 Base) MCG/ACT inhaler Commonly known as:  PROVENTIL HFA;VENTOLIN HFA Inhale 1-2 puffs into the lungs every 6 (six) hours as needed for wheezing or shortness of breath.   doxycycline 100 MG tablet Commonly known as:  VIBRA-TABS Take 1 tablet (100 mg total) by mouth every 12 (twelve) hours for 3 days.   ibuprofen 200 MG tablet Commonly known as:  ADVIL,MOTRIN Take 1 tablet (200 mg total) by mouth every 8 (eight) hours as needed for moderate pain. What changed:    how much to take  when to take this   ondansetron 4 MG disintegrating tablet Commonly known as:  ZOFRAN ODT Take 1 tablet (4 mg total) by mouth every 8 (eight) hours as needed for nausea or vomiting.   oseltamivir 75 MG capsule Commonly known as:  TAMIFLU Take 1 capsule (75 mg total) by mouth every 12 (twelve) hours for 3 days.      Follow-up Information    Health, [facility]. Schedule an appointment as soon as possible for a visit in 1 week(s).   Contact information: [address] [place] [address] [phone]          No Known Allergies Allergies as of 04/20/2018   No Known Allergies     Medication List    TAKE these medications   albuterol 108 (90 Base) MCG/ACT inhaler Commonly known as:  PROVENTIL HFA;VENTOLIN HFA Inhale 1-2 puffs into the lungs every 6 (six) hours as needed for  wheezing or shortness of breath.   doxycycline 100 MG tablet Commonly known as:  VIBRA-TABS Take 1 tablet (100 mg total) by mouth every 12 (twelve) hours for 3 days.   ibuprofen 200 MG tablet Commonly known as:  ADVIL,MOTRIN Take 1 tablet (200 mg total) by mouth every 8 (eight) hours as needed for moderate pain. What changed:    how much to take  when to take this   ondansetron 4 MG disintegrating tablet Commonly known as:  ZOFRAN ODT Take 1 tablet (4 mg total) by mouth every 8 (eight) hours as needed for nausea or vomiting.   oseltamivir 75 MG capsule Commonly known as:  TAMIFLU Take 1 capsule (75 mg total) by mouth every 12 (twelve) hours for 3 days.       Procedures/Studies: Dg Chest 2 View  Result Date: 04/14/2018 CLINICAL DATA:  Flu-like symptoms for a week, cough and body aches. EXAM: CHEST - 2 VIEW COMPARISON:  Chest radiograph August 26, 2017 FINDINGS: Cardiomediastinal silhouette is normal. No pleural effusions or focal consolidations. Minimal RIGHT lung base scarring. Trachea projects midline and there is  no pneumothorax. Soft tissue planes and included osseous structures are non-suspicious. IMPRESSION: No acute cardiopulmonary process. Electronically Signed   By: Awilda Metroourtnay  Bloomer M.D.   On: 04/14/2018 06:05   Dg Chest Port 1 View  Result Date: 04/18/2018 CLINICAL DATA:  Cough EXAM: PORTABLE CHEST 1 VIEW COMPARISON:  04/14/2018 FINDINGS: Cardiac shadow is within normal limits. The lungs are well aerated bilaterally. Mild bibasilar opacities are noted as well as peribronchial thickening. No sizable effusion is seen. IMPRESSION: Mild bibasilar opacities with peribronchial thickening new from the prior exam. Electronically Signed   By: Alcide CleverMark  Lukens M.D.   On: 04/18/2018 07:46      Subjective: Pt reports that she feels much better. She is eating and drinking well and diarrhea has resolved.    Discharge Exam: Vitals:   04/20/18 0605 04/20/18 1417  BP: (!) 144/80 135/81   Pulse: (!) 52 65  Resp:  19  Temp: 98.4 F (36.9 C) 98.5 F (36.9 C)  SpO2: 99% 98%   Vitals:   04/19/18 1337 04/19/18 2120 04/20/18 0605 04/20/18 1417  BP: 140/88 (!) 151/83 (!) 144/80 135/81  Pulse: (!) 52 (!) 57 (!) 52 65  Resp: 17   19  Temp: 97.9 F (36.6 C) 98.3 F (36.8 C) 98.4 F (36.9 C) 98.5 F (36.9 C)  TempSrc: Oral Oral Oral Oral  SpO2: 95% 100% 99% 98%  Weight:      Height:       General: Pt is alert, awake, not in acute distress Cardiovascular: normal S1/S2 +, no rubs, no gallops Respiratory: CTA bilaterally, no wheezing, no rhonchi Abdominal: Soft, NT, ND, bowel sounds + Extremities: no edema, no cyanosis   The results of significant diagnostics from this hospitalization (including imaging, microbiology, ancillary and laboratory) are listed below for reference.    Microbiology: No results found for this or any previous visit (from the past 240 hour(s)).   Labs: BNP (last 3 results) No results for input(s): BNP in the last 8760 hours. Basic Metabolic Panel: Recent Labs  Lab 04/18/18 0528 04/18/18 0804 04/19/18 0614 04/20/18 0513  NA 133*  --  139 138  K 2.9*  --  3.5 3.4*  CL 96*  --  113* 110  CO2 22  --  19* 21*  GLUCOSE 127*  --  97 84  BUN 33*  --  9 5*  CREATININE 1.36*  --  0.60 0.58  CALCIUM 9.1  --  7.4* 7.6*  MG  --  1.8 1.9 1.7   Liver Function Tests: Recent Labs  Lab 04/18/18 0528 04/19/18 0614 04/20/18 0513  AST 53* 24 24  ALT 34 18 14  ALKPHOS 83 50 58  BILITOT 0.4 0.2* 0.2*  PROT 7.9 5.3* 5.4*  ALBUMIN 4.0 2.6* 2.5*   Recent Labs  Lab 04/18/18 0528  LIPASE 56*   No results for input(s): AMMONIA in the last 168 hours. CBC: Recent Labs  Lab 04/18/18 0528 04/19/18 0614 04/20/18 0513  WBC 9.2 7.5 6.8  NEUTROABS 7.5 5.8 5.2  HGB 16.6* 11.9* 11.7*  HCT 48.4* 36.0 35.9*  MCV 95.1 99.7 100.6*  PLT 199 219 300   Cardiac Enzymes: No results for input(s): CKTOTAL, CKMB, CKMBINDEX, TROPONINI in the last 168  hours. BNP: Invalid input(s): POCBNP CBG: Recent Labs  Lab 04/14/18 0608  GLUCAP 115*   D-Dimer No results for input(s): DDIMER in the last 72 hours. Hgb A1c No results for input(s): HGBA1C in the last 72 hours. Lipid Profile No results  for input(s): CHOL, HDL, LDLCALC, TRIG, CHOLHDL, LDLDIRECT in the last 72 hours. Thyroid function studies No results for input(s): TSH, T4TOTAL, T3FREE, THYROIDAB in the last 72 hours.  Invalid input(s): FREET3 Anemia work up No results for input(s): VITAMINB12, FOLATE, FERRITIN, TIBC, IRON, RETICCTPCT in the last 72 hours. Urinalysis    Component Value Date/Time   COLORURINE STRAW (A) 04/18/2018 0838   APPEARANCEUR CLEAR 04/18/2018 0838   LABSPEC 1.009 04/18/2018 0838   PHURINE 6.0 04/18/2018 0838   GLUCOSEU NEGATIVE 04/18/2018 0838   HGBUR MODERATE (A) 04/18/2018 0838   BILIRUBINUR NEGATIVE 04/18/2018 0838   KETONESUR NEGATIVE 04/18/2018 0838   PROTEINUR NEGATIVE 04/18/2018 0838   UROBILINOGEN 0.2 05/05/2007 1144   NITRITE NEGATIVE 04/18/2018 0838   LEUKOCYTESUR NEGATIVE 04/18/2018 0838   Sepsis Labs Invalid input(s): PROCALCITONIN,  WBC,  LACTICIDVEN Microbiology No results found for this or any previous visit (from the past 240 hour(s)).   SIGNED:  Standley Dakins, MD  [facility] 04/20/2018, 2:42 PM How to contact the [facility] Attending or Consulting provider 7A - 7P or covering provider during after hours 7P -7A, for this patient?  1. Check the care team in [facility] and look for a) attending/consulting TRH provider listed and b) the [facility] team listed 2. Log into [email or web address] and use [place]'s universal password to access. If you do not have the password, please contact the hospital operator. 3. Locate the [facility] provider you are looking for under [facility] and page to a number that you can be directly reached. 4. If you still have difficulty reaching the provider, please page the [facility] (Director on Call) for the  Hospitalists listed on amion for assistance.

## 2018-04-20 NOTE — Progress Notes (Signed)
Discharge instructions reviewed with patient. Given AVS. Patient aware to pick up prescriptions from her pharmacy. Verbalized understanding of instructions and f/u with PCP at [facility] Dept. IV sites removed and sites within normal limits. Pt in stable condition awaiting family arrival for discharge home. Earnstine Regal, RN

## 2019-05-19 ENCOUNTER — Other Ambulatory Visit: Payer: Self-pay

## 2019-05-19 ENCOUNTER — Ambulatory Visit: Payer: Medicaid Other | Attending: Internal Medicine

## 2019-05-19 DIAGNOSIS — Z23 Encounter for immunization: Secondary | ICD-10-CM

## 2019-05-19 NOTE — Progress Notes (Signed)
   Covid-19 Vaccination Clinic  Name:  Lauren Mathis    MRN: [id number] DOB: 05-16-1970  05/19/2019  [name] was observed post Covid-19 immunization for 15 minutes without incident. She was provided with Vaccine Information Sheet and instruction to access the V-Safe system.   [name] was instructed to call 911 with any severe reactions post vaccine: Marland Kitchen Difficulty breathing  . Swelling of face and throat  . A fast heartbeat  . A bad rash all over body  . Dizziness and weakness   Immunizations Administered    Name Date Dose VIS Date Route   Moderna COVID-19 Vaccine 05/19/2019  8:51 AM 0.5 mL 01/18/2019 Intramuscular   Manufacturer: Moderna   Lot: [id number]   NDC: 20355-974-16

## 2019-06-09 ENCOUNTER — Ambulatory Visit: Payer: Medicaid Other

## 2019-06-21 ENCOUNTER — Ambulatory Visit: Payer: Medicaid Other

## 2019-06-22 ENCOUNTER — Ambulatory Visit: Payer: Medicaid Other | Attending: Internal Medicine

## 2019-06-22 DIAGNOSIS — Z23 Encounter for immunization: Secondary | ICD-10-CM

## 2019-06-22 NOTE — Progress Notes (Signed)
   Covid-19 Vaccination Clinic  Name:  RUTHMARY OCCHIPINTI    MRN: [id number] DOB: 04/06/1970  06/22/2019  Ms. Amadi was observed post Covid-19 immunization for 15 minutes without incident. She was provided with Vaccine Information Sheet and instruction to access the V-Safe system.   Ms. Dicke was instructed to call 911 with any severe reactions post vaccine: Marland Kitchen Difficulty breathing  . Swelling of face and throat  . A fast heartbeat  . A bad rash all over body  . Dizziness and weakness   Immunizations Administered    Name Date Dose VIS Date Route   Moderna COVID-19 Vaccine 06/22/2019  8:47 AM 0.5 mL 01/2019 Intramuscular   Manufacturer: Moderna   Lot: [id number]   NDC: 70786-754-49

## 2019-11-21 ENCOUNTER — Other Ambulatory Visit (HOSPITAL_COMMUNITY): Payer: Self-pay | Admitting: *Deleted

## 2019-11-21 ENCOUNTER — Other Ambulatory Visit: Payer: Self-pay

## 2019-11-21 ENCOUNTER — Ambulatory Visit (HOSPITAL_COMMUNITY)
Admission: RE | Admit: 2019-11-21 | Discharge: 2019-11-21 | Disposition: A | Discharge status: Home / Self Care | Payer: Self-pay | Source: Ambulatory Visit | Attending: *Deleted | Admitting: *Deleted

## 2019-11-21 DIAGNOSIS — R062 Wheezing: Secondary | ICD-10-CM | POA: Insufficient documentation

## 2019-11-21 DIAGNOSIS — R058 Other specified cough: Secondary | ICD-10-CM | POA: Insufficient documentation

## 2020-01-20 ENCOUNTER — Emergency Department (HOSPITAL_COMMUNITY)
Admission: EM | Admit: 2020-01-20 | Discharge: 2020-01-20 | Disposition: A | Discharge status: Home / Self Care | Payer: Self-pay | Attending: Emergency Medicine | Admitting: Emergency Medicine

## 2020-01-20 ENCOUNTER — Emergency Department (HOSPITAL_COMMUNITY): Payer: Self-pay

## 2020-01-20 ENCOUNTER — Encounter (HOSPITAL_COMMUNITY): Payer: Self-pay | Admitting: *Deleted

## 2020-01-20 ENCOUNTER — Other Ambulatory Visit: Payer: Self-pay

## 2020-01-20 DIAGNOSIS — J410 Simple chronic bronchitis: Secondary | ICD-10-CM | POA: Insufficient documentation

## 2020-01-20 DIAGNOSIS — Z79899 Other long term (current) drug therapy: Secondary | ICD-10-CM | POA: Insufficient documentation

## 2020-01-20 DIAGNOSIS — J40 Bronchitis, not specified as acute or chronic: Secondary | ICD-10-CM

## 2020-01-20 DIAGNOSIS — F17210 Nicotine dependence, cigarettes, uncomplicated: Secondary | ICD-10-CM | POA: Insufficient documentation

## 2020-01-20 DIAGNOSIS — I10 Essential (primary) hypertension: Secondary | ICD-10-CM | POA: Insufficient documentation

## 2020-01-20 DIAGNOSIS — Z20822 Contact with and (suspected) exposure to covid-19: Secondary | ICD-10-CM | POA: Insufficient documentation

## 2020-01-20 DIAGNOSIS — N179 Acute kidney failure, unspecified: Secondary | ICD-10-CM | POA: Insufficient documentation

## 2020-01-20 LAB — RESP PANEL BY RT-PCR (FLU A&B, COVID) ARPGX2
Influenza A by PCR: NEGATIVE
Influenza B by PCR: NEGATIVE
SARS Coronavirus 2 by RT PCR: NEGATIVE

## 2020-01-20 MED ORDER — ALBUTEROL SULFATE HFA 108 (90 BASE) MCG/ACT IN AERS
2.0000 | INHALATION_SPRAY | Freq: Once | RESPIRATORY_TRACT | Status: AC
  Administered 2020-01-20: 2 via RESPIRATORY_TRACT
  Filled 2020-01-20: qty 6.7

## 2020-01-20 MED ORDER — PREDNISONE 10 MG PO TABS: ORAL_TABLET | ORAL | 0 refills | Status: DC

## 2020-01-20 NOTE — ED Notes (Signed)
X-ray at bedside at this time.

## 2020-01-20 NOTE — ED Provider Notes (Signed)
[facility] EMERGENCY DEPARTMENT Provider Note   CSN: [id number] Arrival date & time: 01/20/20  6644     History Chief Complaint  Patient presents with  . Cough    Lauren Mathis is a 49 y.o. female.  The history is provided by the patient. No language interpreter was used.  Cough Cough characteristics:  Productive Sputum characteristics:  Yellow Severity:  Moderate Onset quality:  Gradual Timing:  Constant Progression:  Worsening Chronicity:  New Smoker: no   Context: upper respiratory infection   Relieved by:  Nothing Worsened by:  Nothing Ineffective treatments:  None tried Associated symptoms: shortness of breath   Risk factors: no recent infection        Past Medical History:  Diagnosis Date  . COPD (chronic obstructive pulmonary disease) (HCC)   . Hypertension   . Migraine headache     Patient Active Problem List   Diagnosis Date Noted  . AKI (acute kidney injury) (HCC) 04/18/2018  . Dehydration 04/18/2018  . COPD (chronic obstructive pulmonary disease) (HCC) 04/18/2018  . Hypertension 04/18/2018  . Hypokalemia 04/18/2018  . Nausea and vomiting 04/18/2018  . Diarrhea 04/18/2018  . Influenza A 04/18/2018  . Smoker 04/18/2018    Past Surgical History:  Procedure Laterality Date  . TUBAL LIGATION       OB History    Gravida  3   Para  3   Term  3   Preterm      AB      Living        SAB      TAB      Ectopic      Multiple      Live Births              Family History  Problem Relation Age of Onset  . Asthma Mother   . Asthma Father     Social History   Tobacco Use  . Smoking status: Current Every Day Smoker    Packs/day: 2.00    Types: Cigarettes  . Smokeless tobacco: Never Used  Vaping Use  . Vaping Use: Never used  Substance Use Topics  . Alcohol use: Yes    Alcohol/week: 6.0 standard drinks    Types: 6 Cans of beer per week    Comment: 6-7 beers daily  . Drug use: No    Home Medications Prior to  Admission medications   Medication Sig Start Date End Date Taking? Authorizing Provider  albuterol (PROVENTIL HFA;VENTOLIN HFA) 108 (90 Base) MCG/ACT inhaler Inhale 1-2 puffs into the lungs every 6 (six) hours as needed for wheezing or shortness of breath.    [provider]  ibuprofen (ADVIL,MOTRIN) 200 MG tablet Take 1 tablet (200 mg total) by mouth every 8 (eight) hours as needed for moderate pain. 04/20/18   Johnson, Clanford L, MD  ondansetron (ZOFRAN ODT) 4 MG disintegrating tablet Take 1 tablet (4 mg total) by mouth every 8 (eight) hours as needed for nausea or vomiting. 04/14/18   Rancour, Jeannett Senior, MD    Allergies    Patient has no known allergies.  Review of Systems   Review of Systems  Respiratory: Positive for cough and shortness of breath.   All other systems reviewed and are negative.   Physical Exam Updated Vital Signs BP (!) 142/99 (BP Location: Right Arm)   Pulse 75   Temp 97.7 F (36.5 C) (Oral)   Resp 18   Ht 5\' 3"  (1.6 m)   Wt  58.1 kg   LMP 07/17/2015   SpO2 97%   BMI 22.67 kg/m   Physical Exam Vitals and nursing note reviewed.  Constitutional:      Appearance: She is well-developed.  HENT:     Head: Normocephalic.     Mouth/Throat:     Mouth: Mucous membranes are moist.  Cardiovascular:     Rate and Rhythm: Normal rate.     Pulses: Normal pulses.  Pulmonary:     Effort: Pulmonary effort is normal.     Breath sounds: Rhonchi present.  Abdominal:     General: Abdomen is flat. There is no distension.  Musculoskeletal:        General: Normal range of motion.     Cervical back: Normal range of motion.  Skin:    General: Skin is warm.  Neurological:     Mental Status: She is alert and oriented to person, place, and time.  Psychiatric:        Mood and Affect: Mood normal.     ED Results / Procedures / Treatments   Labs (all labs ordered are listed, but only abnormal results are displayed) Labs Reviewed  RESP PANEL BY RT-PCR (FLU A&B,  COVID) ARPGX2    EKG None  Radiology DG Chest Portable 1 View  Result Date: 01/20/2020 CLINICAL DATA:  Cough since Thursday EXAM: PORTABLE CHEST 1 VIEW COMPARISON:  11/21/2019 FINDINGS: Normal heart size and mediastinal contours. No acute infiltrate or edema. No effusion or pneumothorax. No acute osseous findings. IMPRESSION: Negative chest. Electronically Signed   By: Marnee Spring M.D.   On: 01/20/2020 11:27    Procedures Procedures (including critical care time)  Medications Ordered in ED Medications  albuterol (VENTOLIN HFA) 108 (90 Base) MCG/ACT inhaler 2 puff (2 puffs Inhalation Given 01/20/20 1216)    ED Course  I have reviewed the triage vital signs and the nursing notes.  Pertinent labs & imaging results that were available during my care of the patient were reviewed by me and considered in my medical decision making (see chart for details).    MDM Rules/Calculators/A&P                          Covid and Influenza are negative.  Pt given rx for prednisone and albuterol.  Pt advised to recheck with primary in 2-3 days  Final Clinical Impression(s) / ED Diagnoses Final diagnoses:  Bronchitis  Simple chronic bronchitis (HCC)    Rx / DC Orders ED Discharge Orders         Ordered    predniSONE (DELTASONE) 10 MG tablet       Note to Pharmacy: Please provide 6 day taper dose pack   01/20/20 1348        An After Visit Summary was printed and given to the patient.    Elson Areas, Cordelia Poche 01/20/20 1349    Bethann Berkshire, MD 01/22/20 [phone]

## 2020-01-20 NOTE — ED Notes (Signed)
Entered room and introduced self to patient. Pt appears to be resting in bed, respirations are even and unlabored with equal chest rise and fall. Bed is locked in the lowest position, side rails x2, call bell within reach. Pt educated on call light use and hourly rounding, verbalized understanding and in agreement at this time. All questions and concerns voiced addressed. Refreshments offered and provided per patient request.  Will continue to monitor.   

## 2020-01-20 NOTE — Discharge Instructions (Addendum)
See your Physician for recheck in 2-3 days  Albuterol 2 puffs every 4 hours

## 2020-01-20 NOTE — ED Triage Notes (Signed)
Cough since Thursday, yellow in color. Denies any fevers.

## 2021-07-23 ENCOUNTER — Other Ambulatory Visit (HOSPITAL_COMMUNITY): Payer: Self-pay | Admitting: Physician Assistant

## 2021-07-23 DIAGNOSIS — Z1231 Encounter for screening mammogram for malignant neoplasm of breast: Secondary | ICD-10-CM

## 2021-07-29 ENCOUNTER — Ambulatory Visit (HOSPITAL_COMMUNITY)
Admission: RE | Admit: 2021-07-29 | Discharge: 2021-07-29 | Disposition: A | Discharge status: Home / Self Care | Payer: Medicaid Other | Source: Ambulatory Visit | Attending: Physician Assistant | Admitting: Physician Assistant

## 2021-07-29 DIAGNOSIS — Z1231 Encounter for screening mammogram for malignant neoplasm of breast: Secondary | ICD-10-CM | POA: Diagnosis present

## 2021-07-30 ENCOUNTER — Other Ambulatory Visit: Payer: Self-pay | Admitting: Physician Assistant

## 2021-07-30 ENCOUNTER — Other Ambulatory Visit (HOSPITAL_COMMUNITY): Payer: Self-pay | Admitting: Physician Assistant

## 2021-07-30 DIAGNOSIS — Z720 Tobacco use: Secondary | ICD-10-CM

## 2021-07-30 DIAGNOSIS — Z122 Encounter for screening for malignant neoplasm of respiratory organs: Secondary | ICD-10-CM

## 2021-07-31 ENCOUNTER — Other Ambulatory Visit (HOSPITAL_COMMUNITY): Payer: Self-pay | Admitting: Physician Assistant

## 2021-07-31 DIAGNOSIS — R928 Other abnormal and inconclusive findings on diagnostic imaging of breast: Secondary | ICD-10-CM

## 2021-08-13 ENCOUNTER — Ambulatory Visit (HOSPITAL_COMMUNITY)
Admission: RE | Admit: 2021-08-13 | Discharge: 2021-08-13 | Disposition: A | Discharge status: Home / Self Care | Payer: Medicaid Other | Source: Ambulatory Visit | Attending: Physician Assistant | Admitting: Physician Assistant

## 2021-08-13 DIAGNOSIS — R928 Other abnormal and inconclusive findings on diagnostic imaging of breast: Secondary | ICD-10-CM | POA: Insufficient documentation

## 2021-09-04 ENCOUNTER — Ambulatory Visit (HOSPITAL_COMMUNITY)
Admission: RE | Admit: 2021-09-04 | Discharge: 2021-09-04 | Disposition: A | Discharge status: Home / Self Care | Payer: Medicaid Other | Source: Ambulatory Visit | Attending: Physician Assistant | Admitting: Physician Assistant

## 2021-09-04 DIAGNOSIS — Z122 Encounter for screening for malignant neoplasm of respiratory organs: Secondary | ICD-10-CM | POA: Insufficient documentation

## 2021-09-04 DIAGNOSIS — Z720 Tobacco use: Secondary | ICD-10-CM | POA: Insufficient documentation

## 2021-11-14 ENCOUNTER — Ambulatory Visit
Admission: RE | Admit: 2021-11-14 | Discharge: 2021-11-14 | Disposition: A | Discharge status: Home / Self Care | Payer: Medicaid Other | Source: Ambulatory Visit | Attending: Family Medicine | Admitting: Family Medicine

## 2021-11-14 VITALS — BP 139/81 | HR 99 | Temp 99.2°F | Resp 18

## 2021-11-14 DIAGNOSIS — R197 Diarrhea, unspecified: Secondary | ICD-10-CM | POA: Diagnosis not present

## 2021-11-14 DIAGNOSIS — J441 Chronic obstructive pulmonary disease with (acute) exacerbation: Secondary | ICD-10-CM

## 2021-11-14 DIAGNOSIS — F17210 Nicotine dependence, cigarettes, uncomplicated: Secondary | ICD-10-CM | POA: Insufficient documentation

## 2021-11-14 DIAGNOSIS — J069 Acute upper respiratory infection, unspecified: Secondary | ICD-10-CM

## 2021-11-14 DIAGNOSIS — Z1152 Encounter for screening for COVID-19: Secondary | ICD-10-CM | POA: Insufficient documentation

## 2021-11-14 DIAGNOSIS — R059 Cough, unspecified: Secondary | ICD-10-CM | POA: Diagnosis present

## 2021-11-14 LAB — RESP PANEL BY RT-PCR (FLU A&B, COVID) ARPGX2
Influenza A by PCR: NEGATIVE
Influenza B by PCR: NEGATIVE
SARS Coronavirus 2 by RT PCR: NEGATIVE

## 2021-11-14 MED ORDER — PREDNISONE 20 MG PO TABS: 40.0000 mg | ORAL_TABLET | Freq: Every day | ORAL | 0 refills | Status: DC

## 2021-11-14 MED ORDER — PROMETHAZINE-DM 6.25-15 MG/5ML PO SYRP: 5.0000 mL | ORAL_SOLUTION | Freq: Four times a day (QID) | ORAL | 0 refills | Status: AC | PRN

## 2021-11-14 MED ORDER — MOLNUPIRAVIR EUA 200MG CAPSULE: 4.0000 | ORAL_CAPSULE | Freq: Two times a day (BID) | ORAL | 0 refills | Status: AC

## 2021-11-14 NOTE — ED Triage Notes (Signed)
Patient c/o productive cough, nasal congestion, body aches, headache x 4 days.  Patient has taken Tylenol and Nyquil.

## 2021-11-14 NOTE — ED Provider Notes (Signed)
[facility]    CSN: [id number] Arrival date & time: 11/14/21  0850      History   Chief Complaint Chief Complaint  Patient presents with   Generalized Body Aches    Entered by patient   Appointment    HPI Lauren Mathis is a 51 y.o. female.   Presenting today with 4-day history of productive cough, wheezing, chest tightness, nasal congestion, body aches, chills, headache, nausea, diarrhea.  Denies chest pain, shortness of breath, rashes, intolerance to p.o.  Trying NyQuil, Tylenol with minimal relief.  History of COPD on albuterol as needed.  No known sick contacts recently.    Past Medical History:  Diagnosis Date   COPD (chronic obstructive pulmonary disease) ([place])    Hypertension    Migraine headache     Patient Active Problem List   Diagnosis Date Noted   AKI (acute kidney injury) ([place]) 04/18/2018   Dehydration 04/18/2018   COPD (chronic obstructive pulmonary disease) ([place]) 04/18/2018   Hypertension 04/18/2018   Hypokalemia 04/18/2018   Nausea and vomiting 04/18/2018   Diarrhea 04/18/2018   Influenza A 04/18/2018   Smoker 04/18/2018    Past Surgical History:  Procedure Laterality Date   TUBAL LIGATION      OB History     Gravida  3   Para  3   Term  3   Preterm      AB      Living         SAB      IAB      Ectopic      Multiple      Live Births               Home Medications    Prior to Admission medications   Medication Sig Start Date End Date Taking? Authorizing Provider  molnupiravir EUA (LAGEVRIO) 200 mg CAPS capsule Take 4 capsules (800 mg total) by mouth 2 (two) times daily for 5 days. 11/14/21 11/19/21 Yes Volney American, PA-C  predniSONE (DELTASONE) 20 MG tablet Take 2 tablets (40 mg total) by mouth daily with breakfast. 11/14/21  Yes Volney American, PA-C  promethazine-dextromethorphan (PROMETHAZINE-DM) 6.25-15 MG/5ML syrup Take 5 mLs by mouth 4 (four) times daily as needed. 11/14/21  Yes Volney American, PA-C  albuterol (PROVENTIL HFA;VENTOLIN HFA) 108 (90 Base) MCG/ACT inhaler Inhale 1-2 puffs into the lungs every 6 (six) hours as needed for wheezing or shortness of breath.    [provider]  ibuprofen (ADVIL,MOTRIN) 200 MG tablet Take 1 tablet (200 mg total) by mouth every 8 (eight) hours as needed for moderate pain. 04/20/18   Johnson, Clanford L, MD  ondansetron (ZOFRAN ODT) 4 MG disintegrating tablet Take 1 tablet (4 mg total) by mouth every 8 (eight) hours as needed for nausea or vomiting. 04/14/18   Rancour, Annie Main, MD  predniSONE (DELTASONE) 10 MG tablet 6,5,4,3,2,1 taper 01/20/20   Fransico Meadow, PA-C    Family History Family History  Problem Relation Age of Onset   Asthma Mother    Asthma Father     Social History Social History   Tobacco Use   Smoking status: Every Day    Packs/day: 2.00    Types: Cigarettes   Smokeless tobacco: Never  Vaping Use   Vaping Use: Never used  Substance Use Topics   Alcohol use: Yes    Alcohol/week: 6.0 standard drinks of alcohol    Types: 6 Cans of beer per week  Comment: 6-7 beers daily   Drug use: No     Allergies   Patient has no known allergies.   Review of Systems Review of Systems Per HPI  Physical Exam Triage Vital Signs ED Triage Vitals  Enc Vitals Group     BP 11/14/21 0923 139/81     Pulse Rate 11/14/21 0923 99     Resp 11/14/21 0923 18     Temp 11/14/21 0923 99.2 F (37.3 C)     Temp Source 11/14/21 0923 Oral     SpO2 11/14/21 0923 94 %     Weight --      Height --      Head Circumference --      Peak Flow --      Pain Score 11/14/21 0924 4     Pain Loc --      Pain Edu? --      Excl. in GC? --    No data found.  Updated Vital Signs BP 139/81 (BP Location: Right Arm)   Pulse 99   Temp 99.2 F (37.3 C) (Oral)   Resp 18   LMP 07/17/2015   SpO2 94%   Visual Acuity Right Eye Distance:   Left Eye Distance:   Bilateral Distance:    Right Eye Near:   Left Eye Near:     Bilateral Near:     Physical Exam Vitals and nursing note reviewed.  Constitutional:      Appearance: Normal appearance.  HENT:     Head: Atraumatic.     Right Ear: Tympanic membrane and external ear normal.     Left Ear: Tympanic membrane and external ear normal.     Nose: Rhinorrhea present.     Mouth/Throat:     Mouth: Mucous membranes are moist.     Pharynx: Posterior oropharyngeal erythema present.  Eyes:     Extraocular Movements: Extraocular movements intact.     Conjunctiva/sclera: Conjunctivae normal.  Cardiovascular:     Rate and Rhythm: Normal rate and regular rhythm.     Heart sounds: Normal heart sounds.  Pulmonary:     Effort: Pulmonary effort is normal.     Breath sounds: Wheezing present. No rales.  Musculoskeletal:        General: Normal range of motion.     Cervical back: Normal range of motion and neck supple.  Skin:    General: Skin is warm and dry.  Neurological:     Mental Status: She is alert and oriented to person, place, and time.  Psychiatric:        Mood and Affect: Mood normal.        Thought Content: Thought content normal.      UC Treatments / Results  Labs (all labs ordered are listed, but only abnormal results are displayed) Labs Reviewed  RESP PANEL BY RT-PCR (FLU A&B, COVID) ARPGX2    EKG   Radiology No results found.  Procedures Procedures (including critical care time)  Medications Ordered in UC Medications - No data to display  Initial Impression / Assessment and Plan / UC Course  I have reviewed the triage vital signs and the nursing notes.  Pertinent labs & imaging results that were available during my care of the patient were reviewed by me and considered in my medical decision making (see chart for details).     Highly suspicious for COVID-19 causing a COPD exacerbation.  Given the duration of her symptoms, will initiate molnupiravir prior to obtaining results  for the COVID and flu testing do not miss optimal  window.  We will treat COPD exacerbation with Phenergan DM, prednisone and increased frequency albuterol inhaler.  Discussed supportive care and return precautions.  Work note given.  Final Clinical Impressions(s) / UC Diagnoses   Final diagnoses:  Viral URI with cough  COPD exacerbation [facility])   Discharge Instructions   None    ED Prescriptions     Medication Sig Dispense Auth. Provider   molnupiravir EUA (LAGEVRIO) 200 mg CAPS capsule Take 4 capsules (800 mg total) by mouth 2 (two) times daily for 5 days. 40 capsule Particia Nearing, [place]   predniSONE (DELTASONE) 20 MG tablet Take 2 tablets (40 mg total) by mouth daily with breakfast. 10 tablet Particia Nearing, PA-C   promethazine-dextromethorphan (PROMETHAZINE-DM) 6.25-15 MG/5ML syrup Take 5 mLs by mouth 4 (four) times daily as needed. 100 mL Particia Nearing, [place]      PDMP not reviewed this encounter.   Roosvelt Maser [place], [place] 11/14/21 [phone]

## 2022-06-13 ENCOUNTER — Encounter: Payer: Self-pay | Admitting: Nurse Practitioner

## 2022-06-13 ENCOUNTER — Ambulatory Visit
Admission: EM | Admit: 2022-06-13 | Discharge: 2022-06-13 | Disposition: A | Discharge status: Home / Self Care | Payer: Medicaid Other

## 2022-06-13 DIAGNOSIS — M5442 Lumbago with sciatica, left side: Secondary | ICD-10-CM | POA: Diagnosis not present

## 2022-06-13 MED ORDER — DEXAMETHASONE SODIUM PHOSPHATE 10 MG/ML IJ SOLN
10.0000 mg | INTRAMUSCULAR | Status: AC
  Administered 2022-06-13: 10 mg via INTRAMUSCULAR

## 2022-06-13 MED ORDER — PREDNISONE 20 MG PO TABS: 40.0000 mg | ORAL_TABLET | Freq: Every day | ORAL | 0 refills | Status: AC

## 2022-06-13 MED ORDER — KETOROLAC TROMETHAMINE 30 MG/ML IJ SOLN
30.0000 mg | Freq: Once | INTRAMUSCULAR | Status: AC
  Administered 2022-06-13: 30 mg via INTRAMUSCULAR

## 2022-06-13 MED ORDER — IBUPROFEN 800 MG PO TABS: 800.0000 mg | ORAL_TABLET | Freq: Three times a day (TID) | ORAL | 0 refills | Status: AC

## 2022-06-13 MED ORDER — METHOCARBAMOL 500 MG PO TABS: 500.0000 mg | ORAL_TABLET | Freq: Two times a day (BID) | ORAL | 0 refills | Status: AC

## 2022-06-13 NOTE — Discharge Instructions (Signed)
You were given Decadron 10 mg and Toradol 30 mg intramuscularly today.  Do not take any additional ibuprofen at this time. Take medication as prescribed.  As discussed, do not take ibuprofen while you are taking the steroid prescribed.  After you complete the steroid, you can begin ibuprofen as needed. Recommend Tylenol arthritis strength 650 mg tablets every 8 hours as needed while taking the prednisone. Recommend the use of ice or heat.  Apply ice for pain or swelling, heat for spasm or stiffness.  Apply for 20 minutes, remove for 1 hour, then repeat as needed. Try to remain as active as possible.  Gentle stretching and range of motion exercises are recommended.  Also recommend performing the exercises provided today. If you develop symptoms such as loss of bowel or bladder, the inability to walk, numbness or tingling and weakness in both legs, or worsening pain, recommend going to the emergency department for further evaluation. As discussed, if symptoms do not improve with this treatment, recommend following up with orthopedics.  You can follow-up with [facility] at [phone] or with [facility] at [phone]. Follow-up as needed.

## 2022-06-13 NOTE — ED Triage Notes (Signed)
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 Pt states she has not  filed workers comp and does not plan too, gotten worse the last few days, pt states it hut to sit stand or lay, sitting back with heating pad on it helps a little. X 3 days back pain has progressively gotten worse

## 2022-06-13 NOTE — ED Provider Notes (Signed)
[facility]    CSN: [id number] Arrival date & time: 06/13/22  0815      History   Chief Complaint No chief complaint on file.   HPI Lauren Mathis is a 52 y.o. female.   The history is provided by the patient.   The patient presents for complaints of mid to left-sided low back pain that has been present for the past 3 days.  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  Patient complains of pain that radiates into the left buttock.  She describes the pain as "throbbing" and states that it sometimes is "shooting".  She states that she does have some numbness and tingling in the right lower extremity.  Patient states pain is worse with walking and sitting.  She states that the pain has impacted her sleep.  Patient denies inability to ambulate, loss of bowel or bladder function, or lower extremity weakness.  Patient reports that she does have a history of pulled muscles in her lower back, but nothing more serious.  She reports she has been taking ibuprofen and Tylenol for her pain with minimal relief.  Past Medical History:  Diagnosis Date   COPD (chronic obstructive pulmonary disease) (HCC)    Hypertension    Migraine headache     Patient Active Problem List   Diagnosis Date Noted   AKI (acute kidney injury) (HCC) 04/18/2018   Dehydration 04/18/2018   COPD (chronic obstructive pulmonary disease) (HCC) 04/18/2018   Hypertension 04/18/2018   Hypokalemia 04/18/2018   Nausea and vomiting 04/18/2018   Diarrhea 04/18/2018   Influenza A 04/18/2018   Smoker 04/18/2018    Past Surgical History:  Procedure Laterality Date   TUBAL LIGATION      OB History     Gravida  3   Para  3   Term  3   Preterm      AB      Living         SAB      IAB      Ectopic      Multiple      Live Births               Home Medications    Prior to Admission medications   Medication Sig Start Date End Date  Taking? Authorizing Provider  cetirizine (ZYRTEC) 10 MG tablet Take 10 mg by mouth daily. 05/27/22  Yes [provider]  fluticasone (FLONASE) 50 MCG/ACT nasal spray Place 2 sprays into both nostrils daily.   Yes [provider]  ibuprofen (ADVIL) 800 MG tablet Take 1 tablet (800 mg total) by mouth 3 (three) times daily. 06/13/22  Yes Tamarah Bhullar-Warren, Sadie Haber, NP  methocarbamol (ROBAXIN) 500 MG tablet Take 1 tablet (500 mg total) by mouth 2 (two) times daily. 06/13/22  Yes Trease Bremner-Warren, Sadie Haber, NP  olmesartan (BENICAR) 20 MG tablet Take 20 mg by mouth daily. 05/27/22  Yes [provider]  predniSONE (DELTASONE) 20 MG tablet Take 2 tablets (40 mg total) by mouth daily with breakfast for 5 days. 06/13/22 06/18/22 Yes Jaron Czarnecki-Warren, Sadie Haber, NP  albuterol (PROVENTIL HFA;VENTOLIN HFA) 108 (90 Base) MCG/ACT inhaler Inhale 1-2 puffs into the lungs every 6 (six) hours as needed for wheezing or shortness of breath.    [provider]  ondansetron (ZOFRAN ODT) 4 MG disintegrating tablet Take 1 tablet (4 mg total) by mouth every 8 (eight) hours as needed for nausea  or vomiting. 04/14/18   Rancour, Jeannett Senior, MD  promethazine-dextromethorphan (PROMETHAZINE-DM) 6.25-15 MG/5ML syrup Take 5 mLs by mouth 4 (four) times daily as needed. 11/14/21   Particia Nearing, PA-C    Family History Family History  Problem Relation Age of Onset   Asthma Mother    Asthma Father     Social History Social History   Tobacco Use   Smoking status: Every Day    Packs/day: 2    Types: Cigarettes   Smokeless tobacco: Never  Vaping Use   Vaping Use: Never used  Substance Use Topics   Alcohol use: Yes    Alcohol/week: 6.0 standard drinks of alcohol    Types: 6 Cans of beer per week    Comment: 6-7 beers daily   Drug use: No     Allergies   Patient has no known allergies.   Review of Systems Review of Systems Per HPI  Physical Exam Triage Vital Signs ED Triage Vitals  Enc  Vitals Group     BP 06/13/22 0831 (!) 134/96     Pulse Rate 06/13/22 0831 62     Resp 06/13/22 0831 20     Temp 06/13/22 0831 (!) 97.5 F (36.4 C)     Temp Source 06/13/22 0831 Oral     SpO2 06/13/22 0831 96 %     Weight --      Height --      Head Circumference --      Peak Flow --      Pain Score 06/13/22 0836 7     Pain Loc --      Pain Edu? --      Excl. in GC? --    No data found.  Updated Vital Signs BP (!) 134/96 (BP Location: Right Arm)   Pulse 62   Temp (!) 97.5 F (36.4 C) (Oral)   Resp 20   LMP 07/17/2015   SpO2 96%   Visual Acuity Right Eye Distance:   Left Eye Distance:   Bilateral Distance:    Right Eye Near:   Left Eye Near:    Bilateral Near:     Physical Exam Vitals and nursing note reviewed.  Constitutional:      General: She is not in acute distress.    Appearance: Normal appearance.  HENT:     Head: Normocephalic.  Eyes:     Extraocular Movements: Extraocular movements intact.     Pupils: Pupils are equal, round, and reactive to light.  Cardiovascular:     Rate and Rhythm: Normal rate and regular rhythm.     Pulses: Normal pulses.     Heart sounds: Normal heart sounds.  Pulmonary:     Effort: Pulmonary effort is normal.     Breath sounds: Normal breath sounds.  Abdominal:     General: Bowel sounds are normal.     Palpations: Abdomen is soft.  Musculoskeletal:     Cervical back: Normal range of motion.     Lumbar back: Spasms and tenderness (Left paraspinal region from L1- L4) present. No swelling, deformity or lacerations. Decreased range of motion.  Lymphadenopathy:     Cervical: No cervical adenopathy.  Skin:    General: Skin is warm and dry.  Neurological:     General: No focal deficit present.     Mental Status: She is alert and oriented to person, place, and time.  Psychiatric:        Mood and Affect: Mood normal.  Behavior: Behavior normal.      UC Treatments / Results  Labs (all labs ordered are listed, but  only abnormal results are displayed) Labs Reviewed - No data to display  EKG   Radiology No results found.  Procedures Procedures (including critical care time)  Medications Ordered in UC Medications  ketorolac (TORADOL) 30 MG/ML injection 30 mg (has no administration in time range)  dexamethasone (DECADRON) injection 10 mg (has no administration in time range)    Initial Impression / Assessment and Plan / UC Course  I have reviewed the triage vital signs and the nursing notes.  Pertinent labs & imaging results that were available during my care of the patient were reviewed by me and considered in my medical decision making (see chart for details).  The patient is well-appearing, she is in no acute distress, vital signs are stable.  Symptoms appear to be consistent with left-sided sciatica.  Decadron 10 mg and Toradol 30 mg IM were administered in the clinic.  Methocarbamol 500 mg for muscle spasms, prednisone 40 mg for inflammation, and ibuprofen 800 mg for pain was prescribed for patient's back pain.  Patient was advised to start the ibuprofen after she completed the prednisone.  Patient was advised to use Tylenol arthritis strength 650 mg tablets every 8 hours until she is able to start ibuprofen.  Supportive care recommendations were provided and discussed with the patient to include the use of ice or heat, stretching exercises which were provided, and trying to remain active.  Patient was given strict ER follow-up precautions.  Patient advised that if symptoms do not improve over the next several weeks, recommend that she follow-up with orthopedics for further evaluation.  Patient is in agreement with this plan of care and verbalizes understanding.  All questions were answered.  Work note was provided.  Patient is stable for discharge.   Final Clinical Impressions(s) / UC Diagnoses   Final diagnoses:  Left-sided low back pain with left-sided sciatica, unspecified chronicity      Discharge Instructions      You were given Decadron 10 mg and Toradol 30 mg intramuscularly today.  Do not take any additional ibuprofen at this time. Take medication as prescribed.  As discussed, do not take ibuprofen while you are taking the steroid prescribed.  After you complete the steroid, you can begin ibuprofen as needed. Recommend Tylenol arthritis strength 650 mg tablets every 8 hours as needed while taking the prednisone. Recommend the use of ice or heat.  Apply ice for pain or swelling, heat for spasm or stiffness.  Apply for 20 minutes, remove for 1 hour, then repeat as needed. Try to remain as active as possible.  Gentle stretching and range of motion exercises are recommended.  Also recommend performing the exercises provided today. If you develop symptoms such as loss of bowel or bladder, the inability to walk, numbness or tingling and weakness in both legs, or worsening pain, recommend going to the emergency department for further evaluation. As discussed, if symptoms do not improve with this treatment, recommend following up with orthopedics.  You can follow-up with [facility] [place] at [phone] or with [facility] at [phone]. Follow-up as needed.     ED Prescriptions     Medication Sig Dispense Auth. Provider   predniSONE (DELTASONE) 20 MG tablet Take 2 tablets (40 mg total) by mouth daily with breakfast for 5 days. 10 tablet Farron Lafond-Warren, Sadie Haber, NP   methocarbamol (ROBAXIN) 500 MG tablet Take 1 tablet (  500 mg total) by mouth 2 (two) times daily. 20 tablet Lynze Reddy-Warren, Sadie Haber, NP   ibuprofen (ADVIL) 800 MG tablet Take 1 tablet (800 mg total) by mouth 3 (three) times daily. 21 tablet Treg Diemer-Warren, Sadie Haber, NP      PDMP not reviewed this encounter.   Abran Cantor, NP 06/13/22 [phone]

## 2024-01-03 IMAGING — MG MM DIGITAL SCREENING BILAT W/ TOMO AND CAD
8 series · 8 of 24 positions shown · non-contrast
Comparison: Previous exam(s).

CLINICAL DATA: Screening.

EXAM:
DIGITAL SCREENING BILATERAL MAMMOGRAM WITH TOMOSYNTHESIS AND CAD
TECHNIQUE: Bilateral screening digital craniocaudal and mediolateral oblique
mammograms were obtained. Bilateral screening digital breast
tomosynthesis was performed. The images were evaluated with
computer-aided detection.

[L CC synth-2D]
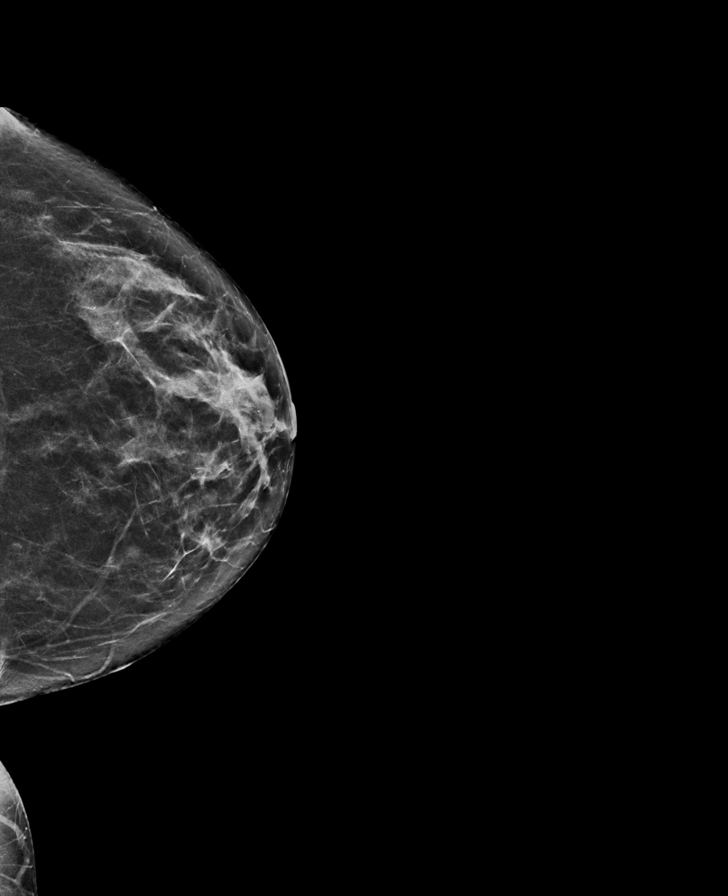

[R CC synth-2D]
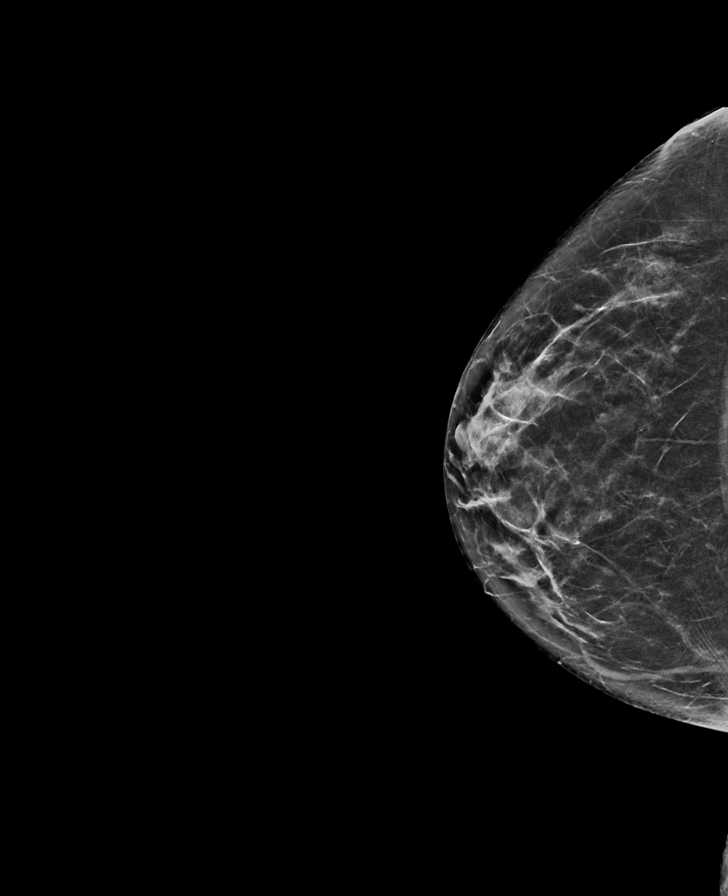

[L MLO synth-2D]
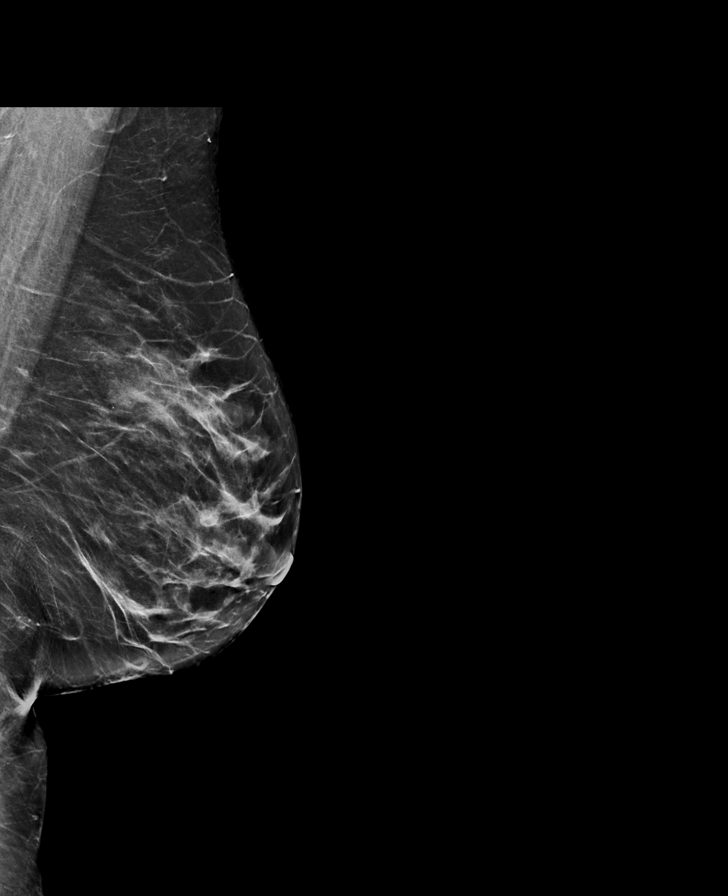

[R MLO synth-2D]
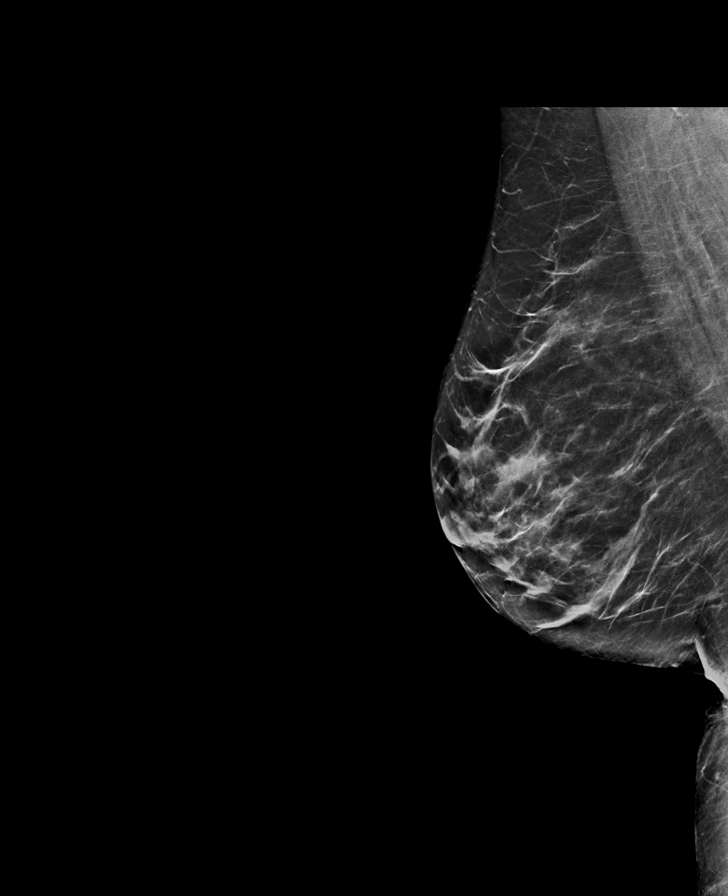

[R CC tomo · tomo slice 31/61.0]
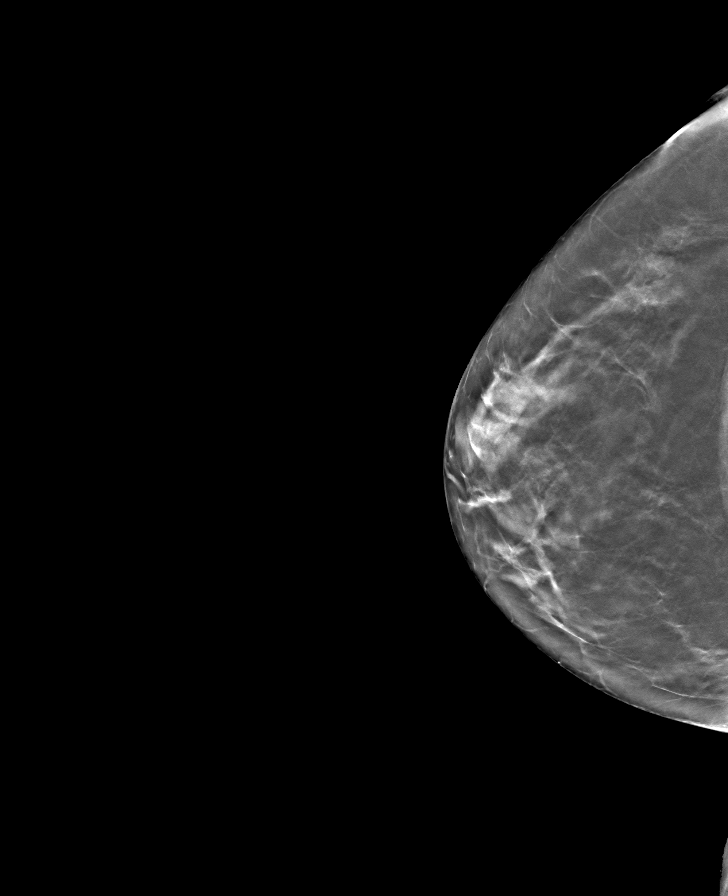

[R MLO tomo · tomo slice 33/66.0]
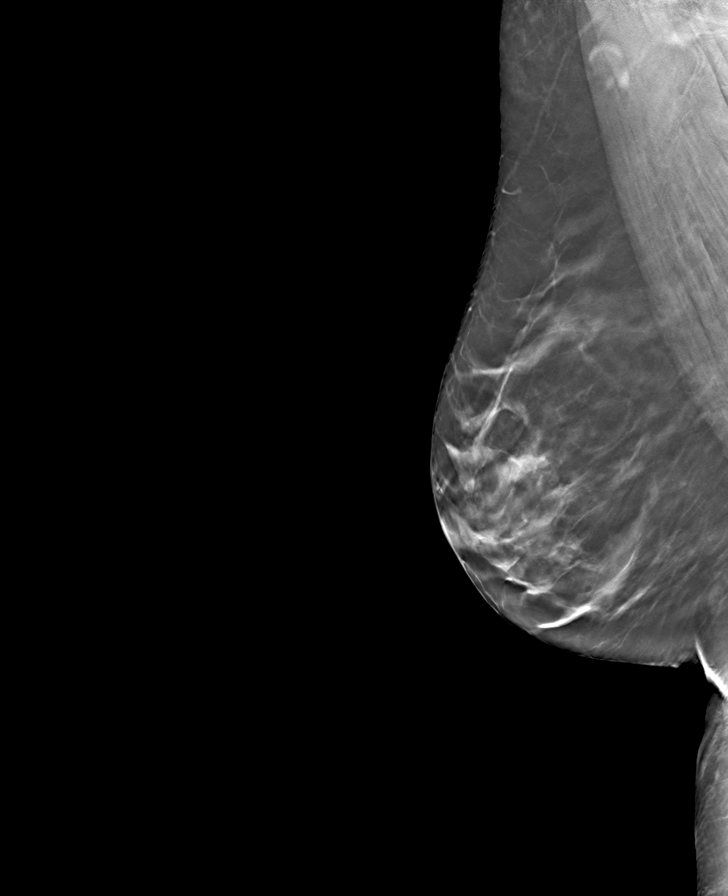

[L MLO tomo · tomo slice 34/67.0]
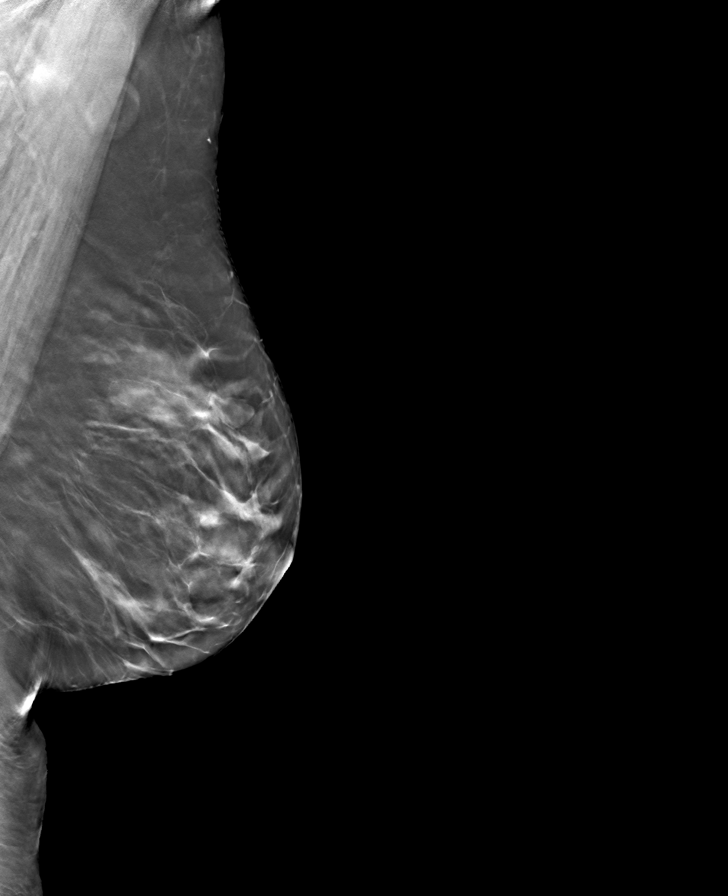

[L CC tomo · tomo slice 30/59.0]
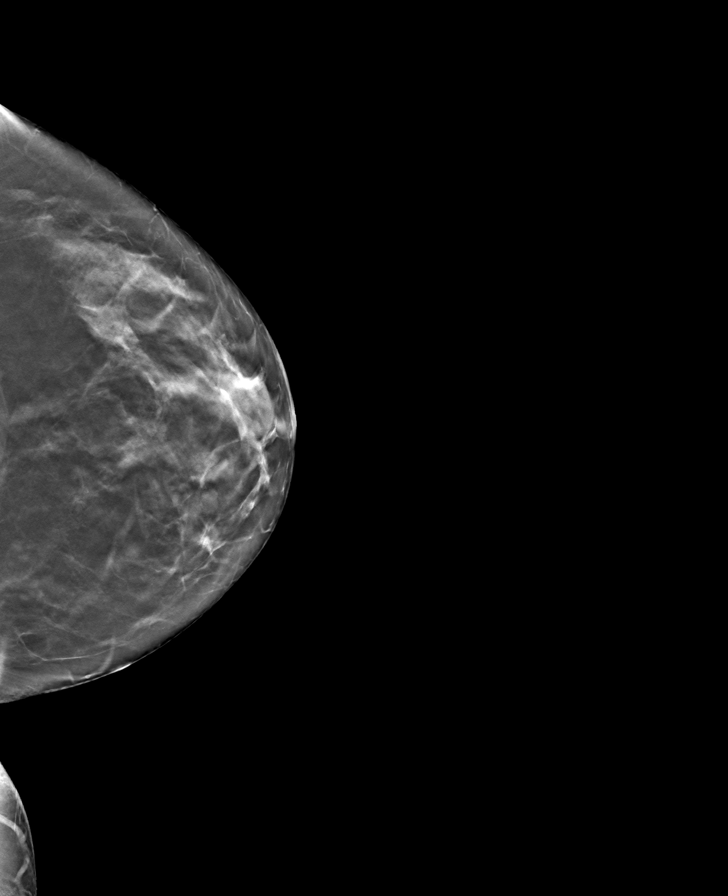

[8 of 24 positions shown; findings below may reference images not displayed]

ACR Breast Density Category c: The breast tissue is heterogeneously
dense, which may obscure small masses.
FINDINGS: In the left breast, 2 possible masses warrant further evaluation. In
the right breast, no findings suspicious for malignancy.
IMPRESSION: Further evaluation is suggested for a possible masses in the left
breast.

RECOMMENDATION:
Diagnostic mammogram and possibly ultrasound of the left breast.
(Code:[id number]-[id number])

The patient will be contacted regarding the findings, and additional
imaging will be scheduled.

BI-RADS CATEGORY  0: Incomplete. Need additional imaging evaluation
and/or prior mammograms for comparison.
